# Patient Record
Sex: Female | Born: 2000 | Race: White
Health system: Southern US, Community
[De-identification: ages and names within clinical notes are randomized; demographics above are authoritative.]

## PROBLEM LIST (undated history)

## (undated) DIAGNOSIS — J984 Other disorders of lung: Secondary | ICD-10-CM

## (undated) DIAGNOSIS — K769 Liver disease, unspecified: Secondary | ICD-10-CM

## (undated) HISTORY — PX: ORIF DISTAL RADIUS FRACTURE: SUR927

## (undated) HISTORY — DX: Cystic fibrosis, unspecified: E84.9

## (undated) HISTORY — DX: Liver disease, unspecified: K76.9

---
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
## 2019-02-18 ENCOUNTER — Ambulatory Visit (INDEPENDENT_AMBULATORY_CARE_PROVIDER_SITE_OTHER): Payer: No Typology Code available for payment source | Admitting: Pediatric Pulmonology

## 2019-02-26 ENCOUNTER — Other Ambulatory Visit (INDEPENDENT_AMBULATORY_CARE_PROVIDER_SITE_OTHER): Payer: Self-pay

## 2019-02-26 ENCOUNTER — Ambulatory Visit (INDEPENDENT_AMBULATORY_CARE_PROVIDER_SITE_OTHER): Payer: No Typology Code available for payment source | Admitting: Pediatric Pulmonology

## 2019-02-26 ENCOUNTER — Ambulatory Visit (INDEPENDENT_AMBULATORY_CARE_PROVIDER_SITE_OTHER): Payer: No Typology Code available for payment source

## 2019-02-26 DIAGNOSIS — R634 Abnormal weight loss: Secondary | ICD-10-CM

## 2019-02-26 DIAGNOSIS — K8689 Other specified diseases of pancreas: Secondary | ICD-10-CM

## 2019-02-26 DIAGNOSIS — K745 Biliary cirrhosis, unspecified: Secondary | ICD-10-CM

## 2019-02-26 DIAGNOSIS — J15212 Pneumonia due to Methicillin resistant Staphylococcus aureus: Secondary | ICD-10-CM

## 2019-02-26 NOTE — Progress Notes (Signed)
Tech only

## 2019-02-26 NOTE — Progress Notes (Signed)
Received verbal order from Dr. Sami for Chest XR PA and Lateral for follow up pneumonia. Order reviewed by Dr. Sami and approved.     Order signed and provided to patient.

## 2019-02-27 ENCOUNTER — Telehealth (INDEPENDENT_AMBULATORY_CARE_PROVIDER_SITE_OTHER): Payer: Self-pay

## 2019-02-27 NOTE — Telephone Encounter (Signed)
Harriett Sine from [facility] called and would like end of therapy directions/orders for patient. States pt received last IV Ceftin yesterday and needs instructions on what to do next. Forwarded message to Dr. Marcelline Deist.

## 2019-02-28 NOTE — Telephone Encounter (Signed)
Dr. Marcelline Deist called Alison Boone.

## 2019-03-07 DIAGNOSIS — R634 Abnormal weight loss: Secondary | ICD-10-CM | POA: Insufficient documentation

## 2019-03-07 NOTE — Progress Notes (Signed)
[facility]  PEDIATRIC PULMONOLOGY OUTPATIENT VISIT    Patient Name: Alison Boone  Primary Care Physician: Dr. Lendell Caprice, Staci Righter, MD  Referring Physician: Dr. Lendell Caprice    Patient Complaint:   Follow-up    History of Present Illness:   Alison Boone is a 18 y.o. 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 She had had a mild cough while in [place] and started on Bactrim which she took for 4 days. She was then referred to the ED for abdominal pain and distension. There it was discovered that she had acute liver decompensation and was air-lifted to [facility] where her GI team is located. She was hospitalized at [facility] for more than 2 weeks. She had an extensive liver transplant evaluation and investigations. Her chest CT showed a severe L-sided pneumonia and she was treated with IV Tobramycin and Cefepime initially. The Tobramycin was discontinued because of concern for kidney injury. She was discharged home on IV Cefepime and has almost completed 3 weeks. PICC is still in place.   Her cough has improved significantly however she becomes short of breath when she tries to exercise and is generally very fatigued and not able to do as much.     Alison Boone is accompanied by mother but both provided history.       Allergies:     Allergies   Allergen Reactions   . Pistachio [Tree Nuts] Rash       Medication:     Current Outpatient Medications   Medication Sig Dispense Refill   . cetirizine (ZYRTEC) 5 MG tablet Take 10 mg by mouth daily        . dornase alpha (PULMOZYME) 1 MG/ML nebulizer solution Inhale 2.5 mg into the lungs.     . fluticasone-salmeterol (ADVAIR DISKUS) 250-50 MCG/DOSE Aerosol Powder, Breath Activtivatede Inhale 1 puff into the lungs 2 (two) times daily.     . insulin aspart (NOVOLOG) 100 UNIT/ML injection Inject into the skin 3 (three) times daily before meals.     . insulin  glargine (LANTUS) 100 UNIT/ML injection Inject 7 Units into the skin daily.     . Multiple Vitamins-Minerals (AQUADEKS PO) Take 2 tablets by mouth.     . pancrelipase, Lip-Prot-Amyl, (CREON) 12000 UNITS Cap DR Particles capsule Take 12,000 units of lipase by mouth 3 (three) times daily with meals.     . pancrelipase, Lip-Prot-Amyl, (CREON) 24000-76000 units Cap DR Particles Take 2 capsules of 24,000 units of lipase by mouth with meals and snacks. 300 capsule 5   . pantoprazole (PROTONIX) 40 MG tablet Take 40 mg by mouth daily     . phytonadione (MEPHYTON) 5 MG tablet Take 5 mg by mouth once     . sodium chloride 7 % Nebu Soln Take 4 mLs by nebulization 2 (two) times daily. 240 mL 5   . spironolactone (ALDACTONE) 100 MG tablet Take 100 mg by mouth 2 (two) times daily     . vitamin D (CHOLECALCIFEROL) 400 UNIT tablet Take 400 Units by mouth daily.     . vitamin E (vitamin E) 1000 UNIT capsule Take 1,000 Units by mouth daily     . albuterol-ipratropium (DUO-NEB) 2.5-0.5(3) mg/3 mL nebulizer Take 3 mLs by nebulization 2 (two) times daily as needed (as needed per sick plan) 90 mL 5   . ONETOUCH VERIO test strip TEST 6 TIMES PER DAY  6  No current facility-administered medications for this visit.        Past Medical History:     Past Medical History:   Diagnosis Date   . Cystic fibrosis    . Disease of lung    . Disorder of liver        Past Medical History personally reviewed  Past Surgical History:     Past Surgical History:   Procedure Laterality Date   . ORIF DISTAL RADIUS FRACTURE     . ORIF, FOREARM Right 10/10/2013    Procedure: ORIF, FOREARM;  Surgeon: Briscoe Deutscher, MD;  Location: [facility] OR;  Service: Orthopedics;  Laterality: Right;  RIGHT FOREARM OPEN REDUCTION INTERNAL FIXATION  W/ SYNTHES ELASTIC NAIL   . REMOVAL HARDWARE UPPER EXTREMITY MINOR LESS THAN 1 HOUR Right 12/12/2013    Procedure: REMOVAL HARDWARE UPPER EXTREMITY MINOR LESS THAN 1 HOUR;  Surgeon: Briscoe Deutscher, MD;  Location: [facility]  OR;  Service: Orthopedics;  Laterality: Right;  RIGHT RADIUS ROD REMOVAL       Family History:   No family history on file.  Family history personally reviewed  Social History:     Social History     Socioeconomic History   . Marital status: Single     Spouse name: Not on file   . Number of children: Not on file   . Years of education: Not on file   . Highest education level: Not on file   Occupational History   . Not on file   Social Needs   . Financial resource strain: Not on file   . Food insecurity     Worry: Not on file     Inability: Not on file   . Transportation needs     Medical: Not on file     Non-medical: Not on file   Tobacco Use   . Smoking status: Never Smoker   Substance and Sexual Activity   . Alcohol use: Not on file   . Drug use: Not on file   . Sexual activity: Not on file   Lifestyle   . Physical activity     Days per week: Not on file     Minutes per session: Not on file   . Stress: Not on file   Relationships   . Social Wellsite geologist on phone: Not on file     Gets together: Not on file     Attends religious service: Not on file     Active member of club or organization: Not on file     Attends meetings of clubs or organizations: Not on file     Relationship status: Not on file   . Intimate partner violence     Fear of current or ex partner: Not on file     Emotionally abused: Not on file     Physically abused: Not on file     Forced sexual activity: Not on file   Other Topics Concern   . Not on file   Social History Narrative   . Not on file     Social history reviewed and updated  Review of Systems:     On review of systems there were no other pulmonary symptoms. Her sleep pattern was sig. For being increased - she is now taking a nap during the day. Her appetite was decreased and she has lost a lot of weight during this hospitalization. She is eating a little  more since discharge but nor enough to gain significant weight.  She has pale but normal bowel movements (1-2/day). There were  no complaints of reflux. Growth and development were normal. There were no complaints referable to her cardiovascular, genitourinary, or neurological systems.   She has CFRD and her Insulin requirements have decreased considerably - she was using only short-acting during the hospitalization and was carb-counting.       Physical Exam:     Vitals:    02/26/19 1312   BP: 114/76   BP Site: Left arm   Patient Position: Sitting   Cuff Size: Small   Pulse: 109   SpO2: 100%   Weight: 45.7 kg (100 lb 12 oz)   Height: 1.593 m (5' 2.72")       Physical Exam  General: Well developed and thin.  HEENT: Head: normocephalic  Ears: tympanic membranes normal bilaterally  Significant for: Nasal turbinates: boggy  Hard palate: high  Soft palate: Long  Mallampati class: 3  Tonsils: not seen  Neck: supple, palpable cervical lymphadenopathy  Chest: symmetric with an increased AP diameter and no accessory muscle use. Equal good aeration bilaterally with harshbreath sounds. Crackles over L. base  Cardiac: S1 and S2 with no murmurs  Abdomen: flat, soft, non-tender, no palpable masses. Liver: palpable at 3-4 cm, spleen palpable 3-4 cm  Extremities: tracedigital clubbing  Musculoskeletal: back midline  Neurological: grossly non-focal  Psychiatric: normal affect  Skin: clear and dry    Labs:        Rads:    CXR: Improved significantly but still showing residual infiltrate/atelectasis in L. 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 She has CFRD, biliary cirrhosis and portal hypertension. She has significant weight loss.     D/W mother that we would keep the PICC in place since its placement was very difficult. We would give her 7-10 days break and check a throat culture today. Based on that growth we would make future antibiotic selection for another 2 week course and then repeat the  CXR.     Will also d/w ID re antibiotic recommendations     Teleconferenced with nutritionist who made recommendations for supplements and enzyme dose   Continue Insulin per endocrinology recs  D/W mum and [name] that we would not be able to start Trikafta back again until she is "cleared" from a liver standpoint which at this point will be after she is transplanted.     FUP: 3-4 weeks       Orders Placed This Encounter   Procedures   . SPIROMETRY REPORT SCAN            Earlean Shawl, MD  Pediatric Pulmonology Physician  [facility]  Dept: [phone]    03/07/2019 2:38 PM

## 2019-03-11 ENCOUNTER — Ambulatory Visit (INDEPENDENT_AMBULATORY_CARE_PROVIDER_SITE_OTHER): Payer: No Typology Code available for payment source | Admitting: Pediatric Pulmonology

## 2019-04-02 ENCOUNTER — Ambulatory Visit (INDEPENDENT_AMBULATORY_CARE_PROVIDER_SITE_OTHER): Payer: No Typology Code available for payment source

## 2019-04-02 ENCOUNTER — Telehealth (INDEPENDENT_AMBULATORY_CARE_PROVIDER_SITE_OTHER): Payer: Self-pay

## 2019-04-02 DIAGNOSIS — J15212 Pneumonia due to Methicillin resistant Staphylococcus aureus: Secondary | ICD-10-CM

## 2019-04-02 NOTE — Telephone Encounter (Signed)
Received verbal order from Dr. Marcelline Deist to enter chest xray for patient to follow up on left lower lobe pneumonia.   Orders entered and emailed to family.

## 2019-04-02 NOTE — Progress Notes (Signed)
Tech only

## 2019-04-08 ENCOUNTER — Ambulatory Visit (INDEPENDENT_AMBULATORY_CARE_PROVIDER_SITE_OTHER): Payer: No Typology Code available for payment source | Admitting: Pediatric Pulmonology

## 2019-04-08 ENCOUNTER — Ambulatory Visit (INDEPENDENT_AMBULATORY_CARE_PROVIDER_SITE_OTHER): Payer: No Typology Code available for payment source

## 2019-04-08 VITALS — BP 109/65 | HR 107 | Ht 62.21 in | Wt 102.5 lb

## 2019-04-08 DIAGNOSIS — K744 Secondary biliary cirrhosis: Secondary | ICD-10-CM

## 2019-04-08 DIAGNOSIS — K8689 Other specified diseases of pancreas: Secondary | ICD-10-CM

## 2019-04-08 DIAGNOSIS — J15212 Pneumonia due to Methicillin resistant Staphylococcus aureus: Secondary | ICD-10-CM

## 2019-04-08 DIAGNOSIS — E0801 Diabetes mellitus due to underlying condition with hyperosmolarity with coma: Secondary | ICD-10-CM

## 2019-04-08 DIAGNOSIS — Z794 Long term (current) use of insulin: Secondary | ICD-10-CM

## 2019-04-08 MED ORDER — SULFAMETHOXAZOLE-TRIMETHOPRIM 800-160 MG PO TABS
1.00 | ORAL_TABLET | Freq: Two times a day (BID) | ORAL | 1 refills | Status: AC
Start: 2019-04-08 — End: 2019-04-22

## 2019-04-08 MED ORDER — LEVOFLOXACIN IN D5W 500 MG/100ML IV SOLN
INTRAVENOUS | 0 refills | Status: DC
Start: 2019-04-08 — End: 2020-11-02

## 2019-04-08 NOTE — Progress Notes (Signed)
Verbal order received from Dr. Marcelline Deist, send new script for Bactrim DS 800-160mg , 1 tab twice daily x 14 days. Prescription read back and sent to pharmacy.     Verbal order called into [facility] by Dr. Marcelline Deist for Levofloxacin 500mg  IV every 24 hours x 14 days.   The infusion company is [employer] [facility] [employer]  [phone] [facility]  [phone]  Fax [phone]

## 2019-04-08 NOTE — Progress Notes (Signed)
Tech only

## 2019-04-08 NOTE — Progress Notes (Signed)
[facility]  PEDIATRIC PULMONOLOGY OUTPATIENT VISIT    Patient Name: Alison Boone  Primary Care Physician: Dr. Lendell Caprice, Staci Righter, MD  Referring Physician: Dr. Bonnetta Barry ref. provider found    Patient Complaint:   Follow-up    History of Present Illness:   Alison Boone is a 19 y.o. female who is here in consultation for CF and liver failure.  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  She was seen in pulmonary clinic at [facility] on Wednesday and had a marked decline in her pulmonary function.  CXR 2 days prior showed improvement in LLL infiltrate c/w prior study.  Since then her fluids have been liberalized and she is now drinking normally. However she is on a very large dose of diuretics - Spironolactone - 100 mg/day and Lasix - 30 mg/day. Her cough has been more productive and not as frequent. She is not having as much difficulty breathing.   Liver transplant prospects: her cousin who is a healthy 60 year old is a good match and has a suitable liver so can give one of her lobes. Another cousin will be investigated this week.     Alison Boone is accompanied by mother but both provided the history.    Allergies:     Allergies   Allergen Reactions   . Pistachio [Tree Nuts] Rash       Medication:     Current Outpatient Medications   Medication Sig Dispense Refill   . albuterol-ipratropium (DUO-NEB) 2.5-0.5(3) mg/3 mL nebulizer Take 3 mLs by nebulization 2 (two) times daily as needed (as needed per sick plan) 90 mL 5   . cetirizine (ZYRTEC) 5 MG tablet Take 10 mg by mouth daily        . dornase alpha (PULMOZYME) 1 MG/ML nebulizer solution Inhale 2.5 mg into the lungs.     . furosemide (LASIX) 20 MG tablet Take 20 mg by mouth 2 (two) times daily 20mg  in the am and 10mg  in the pm     . Multiple Vitamins-Minerals (AQUADEKS PO) Take 2 tablets by mouth.     . pancrelipase, Lip-Prot-Amyl, (CREON) 24000-76000 units Cap DR Particles Take 2 capsules of 24,000 units of lipase by mouth with meals and snacks. 300 capsule 5   . pantoprazole  (PROTONIX) 40 MG tablet Take 40 mg by mouth daily     . sodium chloride 7 % Nebu Soln Take 4 mLs by nebulization 2 (two) times daily. 240 mL 5   . spironolactone (ALDACTONE) 100 MG tablet Take 50 mg by mouth 2 (two) times daily        . vitamin D (CHOLECALCIFEROL) 400 UNIT tablet Take 400 Units by mouth daily.     . vitamin E (vitamin E) 1000 UNIT capsule Take 1,000 Units by mouth daily     . insulin aspart (NOVOLOG) 100 UNIT/ML injection Inject into the skin 3 (three) times daily before meals.     . insulin glargine (LANTUS) 100 UNIT/ML injection Inject 7 Units into the skin daily.     [name] VERIO test strip TEST 6 TIMES PER DAY  6     No current facility-administered medications for this visit.        Past Medical History:     Past Medical History:   Diagnosis Date   . Cystic fibrosis    . Disease of lung    . Disorder of liver        Past Medical History personally reviewed  Past Surgical History:  Past Surgical History:   Procedure Laterality Date   . ORIF DISTAL RADIUS FRACTURE     . ORIF, FOREARM Right 10/10/2013    Procedure: ORIF, FOREARM;  Surgeon: Briscoe Deutscher, MD;  Location: [facility] OR;  Service: Orthopedics;  Laterality: Right;  RIGHT FOREARM OPEN REDUCTION INTERNAL FIXATION  W/ SYNTHES ELASTIC NAIL   . REMOVAL HARDWARE UPPER EXTREMITY MINOR LESS THAN 1 HOUR Right 12/12/2013    Procedure: REMOVAL HARDWARE UPPER EXTREMITY MINOR LESS THAN 1 HOUR;  Surgeon: Briscoe Deutscher, MD;  Location: [facility] OR;  Service: Orthopedics;  Laterality: Right;  RIGHT RADIUS ROD REMOVAL       Family History:   No family history on file.  Family history personally reviewed  Social History:     Social History     Socioeconomic History   . Marital status: Single     Spouse name: Not on file   . Number of children: Not on file   . Years of education: Not on file   . Highest education level: Not on file   Occupational History   . Not on file   Social Needs   . Financial resource strain: Not on file   . Food  insecurity     Worry: Not on file     Inability: Not on file   . Transportation needs     Medical: Not on file     Non-medical: Not on file   Tobacco Use   . Smoking status: Never Smoker   Substance and Sexual Activity   . Alcohol use: Not on file   . Drug use: Not on file   . Sexual activity: Not on file   Lifestyle   . Physical activity     Days per week: Not on file     Minutes per session: Not on file   . Stress: Not on file   Relationships   . Social Wellsite geologist on phone: Not on file     Gets together: Not on file     Attends religious service: Not on file     Active member of club or organization: Not on file     Attends meetings of clubs or organizations: Not on file     Relationship status: Not on file   . Intimate partner violence     Fear of current or ex partner: Not on file     Emotionally abused: Not on file     Physically abused: Not on file     Forced sexual activity: Not on file   Other Topics Concern   . Not on file   Social History Narrative   . Not on file     Social history reviewed and updated  Review of Systems:     On review of systems there were no other pulmonary symptoms. Her sleep pattern was increased. Her appetite was improved as she is now only on 50% of the TPN, with normal bowel movements (1-2/day). There were no complaints of reflux. Growth and development were normal. There were no complaints referable to her cardiovascular, genitourinary, or neurological systems.  Endocrine: Her blood sugar has been slightly higher since she has been on Prednisone but it has been manageable.   She has also been depressed and not wanting to face-time friends but recently met with a few friends in a park.        Physical Exam:     Vitals:  04/08/19 0939   BP: 109/65   BP Site: Left arm   Patient Position: Sitting   Pulse: 107   SpO2: 96%   Weight: 46.5 kg (102 lb 8.2 oz)   Height: 1.58 m (5' 2.21")       Physical Exam  General: Well developed and thin. Visibly jaundiced.   HEENT: Head:  normocephalic  Ears: tympanic membranes normal bilaterally  Significant for: Nasal turbinates: boggy  Hard palate: slightly high and erythematous, however mucosa moist and not as dry as previously  Soft palate: long  Mallampati class: 2-3  Tonsils: not visualized  Neck: supple, no palpable cervical lymphadenopathy  Chest: symmetric with a normal AP diameter and no accessory muscle use. Equal good aeration bilaterally with crackles mostly on left side anteriorly and posteriorly - exam not improved from previously.  Cardiac: S1 and S2 with no murmurs  Abdomen: soft, non-tender, hepatosplenomegaly:+.   Extremities: digital clubbing  Musculoskeletal: back midline  Neurological: grossly non-focal  Psychiatric: normal affect  Skin: clear and dry    Labs:        Rads:    CXR: no significant change    Spirometry:    Severe lower airway obstruction - unchanged from 1/6     Assessment and Recommendations:   Ale[place] is a 19 y.o. female with CF and liver failure.  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     Cx from 04/05/19 is culturing S. Maltophilia sensitive to Bactrim and Levofloxacin - D/W Dr. Luciano Cutter in ID sensitivities and choice of antibiotics.     Also e-mailed [facility] hepatologist and transplant surgeon who agree with antibiotic choice.     Will start on Bactrim 1 DS BID x 2 weeks   IV Levofloxacin 500 mg once daily for 2 weeks   Will order a new Aerobika    FUP: 1 week - will arrange.    Orders Placed This Encounter   Procedures   . X-ray chest PA and lateral     Standing Status:   Future     Standing Expiration Date:   04/07/2020     Order Specific Question:   Is the patient pregnant?     Answer:   No     Order Specific Question:   Reason for Exam:     Answer:   f/u left lower lobe pneumonia            Earlean Shawl, MD  Pediatric Pulmonology Physician  [facility]  Dept: [phone]    04/08/2019 10:39 AM

## 2019-04-09 ENCOUNTER — Telehealth (INDEPENDENT_AMBULATORY_CARE_PROVIDER_SITE_OTHER): Payer: Self-pay

## 2019-04-09 ENCOUNTER — Encounter (INDEPENDENT_AMBULATORY_CARE_PROVIDER_SITE_OTHER): Payer: Self-pay | Admitting: Pediatric Pulmonology

## 2019-04-09 NOTE — Telephone Encounter (Signed)
Per Dr. Marcelline Deist, send [place] orders to DME company.     Faxed [employer] orders to [employer] at [employer]: [phone].

## 2019-04-09 NOTE — Telephone Encounter (Signed)
[place] at [employer] [facility] lvm on nurse's line stating insurance is not covering pt's [place]. She needs demographic sheet faxed so she can inform patient on options.

## 2019-04-22 ENCOUNTER — Ambulatory Visit (INDEPENDENT_AMBULATORY_CARE_PROVIDER_SITE_OTHER): Payer: No Typology Code available for payment source | Admitting: Pediatric Pulmonology

## 2019-06-11 ENCOUNTER — Other Ambulatory Visit (FREE_STANDING_LABORATORY_FACILITY): Payer: No Typology Code available for payment source

## 2019-06-11 DIAGNOSIS — Z944 Liver transplant status: Secondary | ICD-10-CM

## 2019-06-11 LAB — CBC AND DIFFERENTIAL
Absolute NRBC: 0 10*3/uL (ref 0.00–0.00)
Basophils Absolute Automated: 0.04 10*3/uL (ref 0.00–0.08)
Basophils Automated: 1.1 %
Eosinophils Absolute Automated: 0.29 10*3/uL (ref 0.00–0.44)
Eosinophils Automated: 8.2 %
Hematocrit: 36.5 % (ref 34.7–43.7)
Hgb: 11.5 g/dL (ref 11.4–14.8)
Immature Granulocytes Absolute: 0.01 10*3/uL (ref 0.00–0.07)
Immature Granulocytes: 0.3 %
Lymphocytes Absolute Automated: 1.77 10*3/uL (ref 0.42–3.22)
Lymphocytes Automated: 49.9 %
MCH: 29.8 pg (ref 25.1–33.5)
MCHC: 31.5 g/dL (ref 31.5–35.8)
MCV: 94.6 fL (ref 78.0–96.0)
MPV: 11.5 fL (ref 8.9–12.5)
Monocytes Absolute Automated: 0.22 10*3/uL (ref 0.21–0.85)
Monocytes: 6.2 %
Neutrophils Absolute: 1.22 10*3/uL (ref 1.10–6.33)
Neutrophils: 34.3 %
Nucleated RBC: 0 /100 WBC (ref 0.0–0.0)
Platelets: 55 10*3/uL — ABNORMAL LOW (ref 142–346)
RBC: 3.86 10*6/uL — ABNORMAL LOW (ref 3.90–5.10)
RDW: 22 % — ABNORMAL HIGH (ref 11–15)
WBC: 3.55 10*3/uL (ref 3.10–9.50)

## 2019-06-11 LAB — HEMOLYSIS INDEX: Hemolysis Index: 13 (ref 0–18)

## 2019-06-11 LAB — COMPREHENSIVE METABOLIC PANEL
ALT: 49 U/L (ref 0–55)
AST (SGOT): 50 U/L — ABNORMAL HIGH (ref 5–34)
Albumin/Globulin Ratio: 1.1 (ref 0.9–2.2)
Albumin: 3.4 g/dL — ABNORMAL LOW (ref 3.5–5.0)
Alkaline Phosphatase: 277 U/L — ABNORMAL HIGH (ref 50–130)
Anion Gap: 11 (ref 5.0–15.0)
BUN: 14 mg/dL (ref 7.0–19.0)
Bilirubin, Total: 1.3 mg/dL — ABNORMAL HIGH (ref 0.2–1.2)
CO2: 24 mEq/L (ref 21–29)
Calcium: 8.7 mg/dL (ref 8.5–10.5)
Chloride: 105 mEq/L (ref 100–111)
Creatinine: 0.8 mg/dL (ref 0.4–1.5)
Globulin: 3.2 g/dL (ref 2.0–3.7)
Glucose: 142 mg/dL — ABNORMAL HIGH (ref 70–100)
Potassium: 4.4 mEq/L (ref 3.5–5.1)
Protein, Total: 6.6 g/dL (ref 6.0–8.3)
Sodium: 140 mEq/L (ref 136–145)

## 2019-06-11 LAB — CELL MORPHOLOGY
Cell Morphology: ABNORMAL — AB
Platelet Estimate: DECREASED — AB

## 2019-06-11 LAB — MAGNESIUM: Magnesium: 1.7 mg/dL (ref 1.6–2.6)

## 2019-06-11 LAB — BILIRUBIN, DIRECT: Bilirubin Direct: 0.7 mg/dL — ABNORMAL HIGH (ref 0.0–0.5)

## 2019-06-11 LAB — GGT: GGT: 134 U/L — ABNORMAL HIGH (ref 9–36)

## 2019-06-11 LAB — PREALBUMIN: Prealbumin: 23 mg/dL (ref 16.0–38.0)

## 2019-06-11 LAB — PHOSPHORUS: Phosphorus: 3.3 mg/dL (ref 2.3–4.7)

## 2019-06-11 LAB — TACROLIMUS LEVEL: FK506: 3.8 ng/mL — ABNORMAL LOW (ref 5.0–20.0)

## 2019-06-20 ENCOUNTER — Other Ambulatory Visit (FREE_STANDING_LABORATORY_FACILITY): Payer: No Typology Code available for payment source

## 2019-06-20 DIAGNOSIS — Z944 Liver transplant status: Secondary | ICD-10-CM

## 2019-06-20 LAB — CBC AND DIFFERENTIAL
Absolute NRBC: 0 10*3/uL (ref 0.00–0.00)
Basophils Absolute Automated: 0.02 10*3/uL (ref 0.00–0.08)
Basophils Automated: 0.6 %
Eosinophils Absolute Automated: 0.06 10*3/uL (ref 0.00–0.44)
Eosinophils Automated: 1.9 %
Hematocrit: 34.5 % — ABNORMAL LOW (ref 34.7–43.7)
Hgb: 11.2 g/dL — ABNORMAL LOW (ref 11.4–14.8)
Immature Granulocytes Absolute: 0.01 10*3/uL (ref 0.00–0.07)
Immature Granulocytes: 0.3 %
Lymphocytes Absolute Automated: 1.6 10*3/uL (ref 0.42–3.22)
Lymphocytes Automated: 49.4 %
MCH: 30 pg (ref 25.1–33.5)
MCHC: 32.5 g/dL (ref 31.5–35.8)
MCV: 92.5 fL (ref 78.0–96.0)
MPV: 10.2 fL (ref 8.9–12.5)
Monocytes Absolute Automated: 0.25 10*3/uL (ref 0.21–0.85)
Monocytes: 7.7 %
Neutrophils Absolute: 1.3 10*3/uL (ref 1.10–6.33)
Neutrophils: 40.1 %
Nucleated RBC: 0 /100 WBC (ref 0.0–0.0)
Platelets: 55 10*3/uL — ABNORMAL LOW (ref 142–346)
RBC: 3.73 10*6/uL — ABNORMAL LOW (ref 3.90–5.10)
RDW: 17 % — ABNORMAL HIGH (ref 11–15)
WBC: 3.24 10*3/uL (ref 3.10–9.50)

## 2019-06-20 LAB — COMPREHENSIVE METABOLIC PANEL
ALT: 38 U/L (ref 0–55)
AST (SGOT): 27 U/L (ref 5–34)
Albumin/Globulin Ratio: 1.2 (ref 0.9–2.2)
Albumin: 3.6 g/dL (ref 3.5–5.0)
Alkaline Phosphatase: 141 U/L — ABNORMAL HIGH (ref 50–130)
Anion Gap: 7 (ref 5.0–15.0)
BUN: 16 mg/dL (ref 7.0–19.0)
Bilirubin, Total: 1 mg/dL (ref 0.2–1.2)
CO2: 27 mEq/L (ref 21–29)
Calcium: 8.9 mg/dL (ref 8.5–10.5)
Chloride: 105 mEq/L (ref 100–111)
Creatinine: 0.9 mg/dL (ref 0.4–1.5)
Globulin: 3 g/dL (ref 2.0–3.7)
Glucose: 146 mg/dL — ABNORMAL HIGH (ref 70–100)
Potassium: 5.1 mEq/L (ref 3.5–5.1)
Protein, Total: 6.6 g/dL (ref 6.0–8.3)
Sodium: 139 mEq/L (ref 136–145)

## 2019-06-20 LAB — BILIRUBIN, DIRECT: Bilirubin Direct: 0.5 mg/dL (ref 0.0–0.5)

## 2019-06-20 LAB — GGT: GGT: 73 U/L — ABNORMAL HIGH (ref 9–36)

## 2019-06-20 LAB — TACROLIMUS LEVEL: FK506: 5.3 ng/mL (ref 5.0–20.0)

## 2019-06-20 LAB — MAGNESIUM: Magnesium: 1.8 mg/dL (ref 1.6–2.6)

## 2019-06-20 LAB — HEMOLYSIS INDEX: Hemolysis Index: 4 (ref 0–18)

## 2019-06-20 LAB — PHOSPHORUS: Phosphorus: 3.9 mg/dL (ref 2.3–4.7)

## 2019-06-21 ENCOUNTER — Encounter (INDEPENDENT_AMBULATORY_CARE_PROVIDER_SITE_OTHER): Payer: Self-pay | Admitting: Pediatric Pulmonology

## 2019-06-23 LAB — VITAMIN E: Vitamin E (Alpha Tocopherol): 18.9 mg/L — ABNORMAL HIGH (ref 5.5–17.0)

## 2019-06-27 ENCOUNTER — Other Ambulatory Visit (FREE_STANDING_LABORATORY_FACILITY): Payer: No Typology Code available for payment source

## 2019-06-27 DIAGNOSIS — Z944 Liver transplant status: Secondary | ICD-10-CM

## 2019-06-27 LAB — CBC AND DIFFERENTIAL
Absolute NRBC: 0 10*3/uL (ref 0.00–0.00)
Basophils Absolute Automated: 0.02 10*3/uL (ref 0.00–0.08)
Basophils Automated: 0.6 %
Eosinophils Absolute Automated: 0.07 10*3/uL (ref 0.00–0.44)
Eosinophils Automated: 2.2 %
Hematocrit: 36.9 % (ref 34.7–43.7)
Hgb: 12 g/dL (ref 11.4–14.8)
Immature Granulocytes Absolute: 0.01 10*3/uL (ref 0.00–0.07)
Immature Granulocytes: 0.3 %
Lymphocytes Absolute Automated: 1.51 10*3/uL (ref 0.42–3.22)
Lymphocytes Automated: 47.3 %
MCH: 30.3 pg (ref 25.1–33.5)
MCHC: 32.5 g/dL (ref 31.5–35.8)
MCV: 93.2 fL (ref 78.0–96.0)
MPV: 9.7 fL (ref 8.9–12.5)
Monocytes Absolute Automated: 0.19 10*3/uL — ABNORMAL LOW (ref 0.21–0.85)
Monocytes: 6 %
Neutrophils Absolute: 1.39 10*3/uL (ref 1.10–6.33)
Neutrophils: 43.6 %
Nucleated RBC: 0 /100 WBC (ref 0.0–0.0)
Platelets: 47 10*3/uL — ABNORMAL LOW (ref 142–346)
RBC: 3.96 10*6/uL (ref 3.90–5.10)
RDW: 15 % (ref 11–15)
WBC: 3.19 10*3/uL (ref 3.10–9.50)

## 2019-06-27 LAB — COMPREHENSIVE METABOLIC PANEL
ALT: 41 U/L (ref 0–55)
AST (SGOT): 32 U/L (ref 5–34)
Albumin/Globulin Ratio: 1.3 (ref 0.9–2.2)
Albumin: 3.7 g/dL (ref 3.5–5.0)
Alkaline Phosphatase: 89 U/L (ref 50–130)
Anion Gap: 12 (ref 5.0–15.0)
BUN: 15 mg/dL (ref 7.0–19.0)
Bilirubin, Total: 0.9 mg/dL (ref 0.2–1.2)
CO2: 25 mEq/L (ref 21–29)
Calcium: 9.4 mg/dL (ref 8.5–10.5)
Chloride: 104 mEq/L (ref 100–111)
Creatinine: 0.9 mg/dL (ref 0.4–1.5)
Globulin: 2.8 g/dL (ref 2.0–3.7)
Glucose: 139 mg/dL — ABNORMAL HIGH (ref 70–100)
Potassium: 5.1 mEq/L (ref 3.5–5.1)
Protein, Total: 6.5 g/dL (ref 6.0–8.3)
Sodium: 141 mEq/L (ref 136–145)

## 2019-06-27 LAB — MAGNESIUM: Magnesium: 1.8 mg/dL (ref 1.6–2.6)

## 2019-06-27 LAB — HEMOLYSIS INDEX: Hemolysis Index: 5 (ref 0–18)

## 2019-06-27 LAB — BILIRUBIN, DIRECT: Bilirubin Direct: 0.4 mg/dL (ref 0.0–0.5)

## 2019-06-27 LAB — TACROLIMUS LEVEL: FK506: 5.3 ng/mL (ref 5.0–20.0)

## 2019-06-27 LAB — GGT: GGT: 41 U/L — ABNORMAL HIGH (ref 9–36)

## 2019-06-27 LAB — PHOSPHORUS: Phosphorus: 4.4 mg/dL (ref 2.3–4.7)

## 2019-06-27 LAB — IMMATURE PLT FRACTION: Immature Platelet Fraction: 2.5 % (ref 0.1–8.6)

## 2019-07-02 ENCOUNTER — Other Ambulatory Visit (FREE_STANDING_LABORATORY_FACILITY): Payer: No Typology Code available for payment source

## 2019-07-02 DIAGNOSIS — Z940 Kidney transplant status: Secondary | ICD-10-CM

## 2019-07-02 LAB — CBC AND DIFFERENTIAL
Absolute NRBC: 0 10*3/uL (ref 0.00–0.00)
Basophils Absolute Automated: 0.01 10*3/uL (ref 0.00–0.08)
Basophils Automated: 0.3 %
Eosinophils Absolute Automated: 0.1 10*3/uL (ref 0.00–0.44)
Eosinophils Automated: 2.8 %
Hematocrit: 36.2 % (ref 34.7–43.7)
Hgb: 12.1 g/dL (ref 11.4–14.8)
Immature Granulocytes Absolute: 0.01 10*3/uL (ref 0.00–0.07)
Immature Granulocytes: 0.3 %
Lymphocytes Absolute Automated: 1.53 10*3/uL (ref 0.42–3.22)
Lymphocytes Automated: 43.1 %
MCH: 30.3 pg (ref 25.1–33.5)
MCHC: 33.4 g/dL (ref 31.5–35.8)
MCV: 90.7 fL (ref 78.0–96.0)
MPV: 10 fL (ref 8.9–12.5)
Monocytes Absolute Automated: 0.21 10*3/uL (ref 0.21–0.85)
Monocytes: 5.9 %
Neutrophils Absolute: 1.69 10*3/uL (ref 1.10–6.33)
Neutrophils: 47.6 %
Nucleated RBC: 0 /100 WBC (ref 0.0–0.0)
Platelets: 49 10*3/uL — ABNORMAL LOW (ref 142–346)
RBC: 3.99 10*6/uL (ref 3.90–5.10)
RDW: 14 % (ref 11–15)
WBC: 3.55 10*3/uL (ref 3.10–9.50)

## 2019-07-02 LAB — COMPREHENSIVE METABOLIC PANEL
ALT: 57 U/L — ABNORMAL HIGH (ref 0–55)
AST (SGOT): 30 U/L (ref 5–34)
Albumin/Globulin Ratio: 1.5 (ref 0.9–2.2)
Albumin: 3.8 g/dL (ref 3.5–5.0)
Alkaline Phosphatase: 87 U/L (ref 50–130)
Anion Gap: 11 (ref 5.0–15.0)
BUN: 13 mg/dL (ref 7.0–19.0)
Bilirubin, Total: 0.9 mg/dL (ref 0.2–1.2)
CO2: 28 mEq/L (ref 21–29)
Calcium: 9 mg/dL (ref 8.5–10.5)
Chloride: 102 mEq/L (ref 100–111)
Creatinine: 0.8 mg/dL (ref 0.4–1.5)
Globulin: 2.6 g/dL (ref 2.0–3.7)
Glucose: 114 mg/dL — ABNORMAL HIGH (ref 70–100)
Potassium: 4.7 mEq/L (ref 3.5–5.1)
Protein, Total: 6.4 g/dL (ref 6.0–8.3)
Sodium: 141 mEq/L (ref 136–145)

## 2019-07-02 LAB — TACROLIMUS LEVEL: FK506: 7.9 ng/mL (ref 5.0–20.0)

## 2019-07-02 LAB — HEMOLYSIS INDEX: Hemolysis Index: 8 (ref 0–18)

## 2019-07-02 LAB — IMMATURE PLT FRACTION: Immature Platelet Fraction: 2.6 % (ref 0.1–8.6)

## 2019-07-02 LAB — BILIRUBIN, DIRECT: Bilirubin Direct: 0.4 mg/dL (ref 0.0–0.5)

## 2019-07-02 LAB — GGT: GGT: 39 U/L — ABNORMAL HIGH (ref 9–36)

## 2019-07-02 LAB — PHOSPHORUS: Phosphorus: 3.9 mg/dL (ref 2.3–4.7)

## 2019-07-02 LAB — MAGNESIUM: Magnesium: 1.8 mg/dL (ref 1.6–2.6)

## 2019-07-04 ENCOUNTER — Other Ambulatory Visit (FREE_STANDING_LABORATORY_FACILITY): Payer: No Typology Code available for payment source

## 2019-07-04 DIAGNOSIS — Z944 Liver transplant status: Secondary | ICD-10-CM

## 2019-07-04 LAB — COMPREHENSIVE METABOLIC PANEL
ALT: 49 U/L (ref 0–55)
AST (SGOT): 23 U/L (ref 5–34)
Albumin/Globulin Ratio: 1.4 (ref 0.9–2.2)
Albumin: 3.8 g/dL (ref 3.5–5.0)
Alkaline Phosphatase: 81 U/L (ref 50–130)
Anion Gap: 10 (ref 5.0–15.0)
BUN: 17 mg/dL (ref 7.0–19.0)
Bilirubin, Total: 0.8 mg/dL (ref 0.2–1.2)
CO2: 25 mEq/L (ref 21–29)
Calcium: 8.6 mg/dL (ref 8.5–10.5)
Chloride: 105 mEq/L (ref 100–111)
Creatinine: 0.8 mg/dL (ref 0.4–1.5)
Globulin: 2.7 g/dL (ref 2.0–3.7)
Glucose: 95 mg/dL (ref 70–100)
Potassium: 4.5 mEq/L (ref 3.5–5.1)
Protein, Total: 6.5 g/dL (ref 6.0–8.3)
Sodium: 140 mEq/L (ref 136–145)

## 2019-07-04 LAB — CBC AND DIFFERENTIAL
Absolute NRBC: 0 10*3/uL (ref 0.00–0.00)
Basophils Absolute Automated: 0.02 10*3/uL (ref 0.00–0.08)
Basophils Automated: 0.5 %
Eosinophils Absolute Automated: 0.09 10*3/uL (ref 0.00–0.44)
Eosinophils Automated: 2.4 %
Hematocrit: 35.7 % (ref 34.7–43.7)
Hgb: 11.7 g/dL (ref 11.4–14.8)
Immature Granulocytes Absolute: 0.01 10*3/uL (ref 0.00–0.07)
Immature Granulocytes: 0.3 %
Lymphocytes Absolute Automated: 1.66 10*3/uL (ref 0.42–3.22)
Lymphocytes Automated: 44 %
MCH: 30.1 pg (ref 25.1–33.5)
MCHC: 32.8 g/dL (ref 31.5–35.8)
MCV: 91.8 fL (ref 78.0–96.0)
MPV: 10.2 fL (ref 8.9–12.5)
Monocytes Absolute Automated: 0.24 10*3/uL (ref 0.21–0.85)
Monocytes: 6.4 %
Neutrophils Absolute: 1.75 10*3/uL (ref 1.10–6.33)
Neutrophils: 46.4 %
Nucleated RBC: 0 /100 WBC (ref 0.0–0.0)
Platelets: 46 10*3/uL — ABNORMAL LOW (ref 142–346)
RBC: 3.89 10*6/uL — ABNORMAL LOW (ref 3.90–5.10)
RDW: 14 % (ref 11–15)
WBC: 3.77 10*3/uL (ref 3.10–9.50)

## 2019-07-04 LAB — TACROLIMUS LEVEL: FK506: 6.2 ng/mL (ref 5.0–20.0)

## 2019-07-04 LAB — EBV DNA, QUANTITATIVE PCR
Epstein-Barr Virus DNA Quan PCR Log Copies/ML: <2.3 (ref <2.30)
Epstein-Barr virus, DNA, QUAN PCR: <200 (ref <200)

## 2019-07-04 LAB — GGT: GGT: 36 U/L (ref 9–36)

## 2019-07-04 LAB — PHOSPHORUS: Phosphorus: 3.8 mg/dL (ref 2.3–4.7)

## 2019-07-04 LAB — BILIRUBIN, DIRECT: Bilirubin Direct: 0.4 mg/dL (ref 0.0–0.5)

## 2019-07-04 LAB — CYTOMEGALOVIRUS DNA, QUANTITATIVE REAL-TIME PCR
CMV DNA, QN PCR: <2.3 (ref <2.30)
CMV DNA,QN Real Time PCR: <200 (ref <200)

## 2019-07-04 LAB — HEMOLYSIS INDEX: Hemolysis Index: 8 (ref 0–18)

## 2019-07-04 LAB — IMMATURE PLT FRACTION: Immature Platelet Fraction: 2.1 % (ref 0.1–8.6)

## 2019-07-04 LAB — MAGNESIUM: Magnesium: 2 mg/dL (ref 1.6–2.6)

## 2019-07-08 ENCOUNTER — Other Ambulatory Visit (FREE_STANDING_LABORATORY_FACILITY): Payer: No Typology Code available for payment source

## 2019-07-08 DIAGNOSIS — Z944 Liver transplant status: Secondary | ICD-10-CM

## 2019-07-08 LAB — CBC AND DIFFERENTIAL
Absolute NRBC: 0 10*3/uL (ref 0.00–0.00)
Basophils Absolute Automated: 0.01 10*3/uL (ref 0.00–0.08)
Basophils Automated: 0.3 %
Eosinophils Absolute Automated: 0.1 10*3/uL (ref 0.00–0.44)
Eosinophils Automated: 2.6 %
Hematocrit: 39.5 % (ref 34.7–43.7)
Hgb: 13 g/dL (ref 11.4–14.8)
Immature Granulocytes Absolute: 0.02 10*3/uL (ref 0.00–0.07)
Immature Granulocytes: 0.5 %
Lymphocytes Absolute Automated: 1.59 10*3/uL (ref 0.42–3.22)
Lymphocytes Automated: 41.6 %
MCH: 29.6 pg (ref 25.1–33.5)
MCHC: 32.9 g/dL (ref 31.5–35.8)
MCV: 90 fL (ref 78.0–96.0)
MPV: 10.1 fL (ref 8.9–12.5)
Monocytes Absolute Automated: 0.24 10*3/uL (ref 0.21–0.85)
Monocytes: 6.3 %
Neutrophils Absolute: 1.86 10*3/uL (ref 1.10–6.33)
Neutrophils: 48.7 %
Nucleated RBC: 0 /100 WBC (ref 0.0–0.0)
Platelets: 51 10*3/uL — ABNORMAL LOW (ref 142–346)
RBC: 4.39 10*6/uL (ref 3.90–5.10)
RDW: 14 % (ref 11–15)
WBC: 3.82 10*3/uL (ref 3.10–9.50)

## 2019-07-08 LAB — COMPREHENSIVE METABOLIC PANEL
ALT: 70 U/L — ABNORMAL HIGH (ref 0–55)
AST (SGOT): 38 U/L — ABNORMAL HIGH (ref 5–34)
Albumin/Globulin Ratio: 1.5 (ref 0.9–2.2)
Albumin: 4.1 g/dL (ref 3.5–5.0)
Alkaline Phosphatase: 92 U/L (ref 50–130)
Anion Gap: 10 (ref 5.0–15.0)
BUN: 14 mg/dL (ref 7.0–19.0)
Bilirubin, Total: 0.8 mg/dL (ref 0.2–1.2)
CO2: 26 mEq/L (ref 21–29)
Calcium: 9.2 mg/dL (ref 8.5–10.5)
Chloride: 105 mEq/L (ref 100–111)
Creatinine: 1 mg/dL (ref 0.4–1.5)
Globulin: 2.8 g/dL (ref 2.0–3.7)
Glucose: 117 mg/dL — ABNORMAL HIGH (ref 70–100)
Potassium: 5.1 mEq/L (ref 3.5–5.1)
Protein, Total: 6.9 g/dL (ref 6.0–8.3)
Sodium: 141 mEq/L (ref 136–145)

## 2019-07-08 LAB — BILIRUBIN, DIRECT: Bilirubin Direct: 0.3 mg/dL (ref 0.0–0.5)

## 2019-07-08 LAB — PHOSPHORUS: Phosphorus: 3.8 mg/dL (ref 2.3–4.7)

## 2019-07-08 LAB — GGT: GGT: 65 U/L — ABNORMAL HIGH (ref 9–36)

## 2019-07-08 LAB — IMMATURE PLT FRACTION: Immature Platelet Fraction: 2.2 % (ref 0.1–8.6)

## 2019-07-08 LAB — MAGNESIUM: Magnesium: 1.8 mg/dL (ref 1.6–2.6)

## 2019-07-08 LAB — TACROLIMUS LEVEL: FK506: 5.3 ng/mL (ref 5.0–20.0)

## 2019-07-08 LAB — HEMOLYSIS INDEX: Hemolysis Index: 10 (ref 0–18)

## 2019-07-12 ENCOUNTER — Other Ambulatory Visit (FREE_STANDING_LABORATORY_FACILITY): Payer: No Typology Code available for payment source

## 2019-07-12 DIAGNOSIS — Z944 Liver transplant status: Secondary | ICD-10-CM

## 2019-07-12 LAB — CBC AND DIFFERENTIAL
Absolute NRBC: 0 10*3/uL (ref 0.00–0.00)
Basophils Absolute Automated: 0.02 10*3/uL (ref 0.00–0.08)
Basophils Automated: 0.5 %
Eosinophils Absolute Automated: 0.07 10*3/uL (ref 0.00–0.44)
Eosinophils Automated: 1.7 %
Hematocrit: 40 % (ref 34.7–43.7)
Hgb: 13.5 g/dL (ref 11.4–14.8)
Immature Granulocytes Absolute: 0.01 10*3/uL (ref 0.00–0.07)
Immature Granulocytes: 0.2 %
Lymphocytes Absolute Automated: 1.75 10*3/uL (ref 0.42–3.22)
Lymphocytes Automated: 43.2 %
MCH: 29.5 pg (ref 25.1–33.5)
MCHC: 33.8 g/dL (ref 31.5–35.8)
MCV: 87.3 fL (ref 78.0–96.0)
MPV: 9.4 fL (ref 8.9–12.5)
Monocytes Absolute Automated: 0.25 10*3/uL (ref 0.21–0.85)
Monocytes: 6.2 %
Neutrophils Absolute: 1.95 10*3/uL (ref 1.10–6.33)
Neutrophils: 48.2 %
Nucleated RBC: 0 /100 WBC (ref 0.0–0.0)
Platelets: 50 10*3/uL — ABNORMAL LOW (ref 142–346)
RBC: 4.58 10*6/uL (ref 3.90–5.10)
RDW: 13 % (ref 11–15)
WBC: 4.05 10*3/uL (ref 3.10–9.50)

## 2019-07-12 LAB — GGT: GGT: 54 U/L — ABNORMAL HIGH (ref 9–36)

## 2019-07-12 LAB — COMPREHENSIVE METABOLIC PANEL
ALT: 50 U/L (ref 0–55)
AST (SGOT): 24 U/L (ref 5–34)
Albumin/Globulin Ratio: 1.5 (ref 0.9–2.2)
Albumin: 4.5 g/dL (ref 3.5–5.0)
Alkaline Phosphatase: 95 U/L (ref 50–130)
Anion Gap: 11 (ref 5.0–15.0)
BUN: 18 mg/dL (ref 7.0–19.0)
Bilirubin, Total: 1 mg/dL (ref 0.2–1.2)
CO2: 28 mEq/L (ref 21–29)
Calcium: 9.5 mg/dL (ref 8.5–10.5)
Chloride: 102 mEq/L (ref 100–111)
Creatinine: 1 mg/dL (ref 0.4–1.5)
Globulin: 3.1 g/dL (ref 2.0–3.7)
Glucose: 139 mg/dL — ABNORMAL HIGH (ref 70–100)
Potassium: 4.6 mEq/L (ref 3.5–5.1)
Protein, Total: 7.6 g/dL (ref 6.0–8.3)
Sodium: 141 mEq/L (ref 136–145)

## 2019-07-12 LAB — MAGNESIUM: Magnesium: 2 mg/dL (ref 1.6–2.6)

## 2019-07-12 LAB — PHOSPHORUS: Phosphorus: 3.8 mg/dL (ref 2.3–4.7)

## 2019-07-12 LAB — BILIRUBIN, DIRECT: Bilirubin Direct: 0.4 mg/dL (ref 0.0–0.5)

## 2019-07-12 LAB — TACROLIMUS LEVEL: FK506: 7.3 ng/mL (ref 5.0–20.0)

## 2019-07-12 LAB — HEMOLYSIS INDEX: Hemolysis Index: 6 (ref 0–18)

## 2019-07-12 LAB — IMMATURE PLT FRACTION: Immature Platelet Fraction: 2.6 % (ref 0.1–8.6)

## 2019-07-15 ENCOUNTER — Encounter (INDEPENDENT_AMBULATORY_CARE_PROVIDER_SITE_OTHER): Payer: Self-pay

## 2019-07-15 ENCOUNTER — Other Ambulatory Visit (INDEPENDENT_AMBULATORY_CARE_PROVIDER_SITE_OTHER): Payer: Self-pay | Admitting: Pediatric Infectious Disease

## 2019-07-17 ENCOUNTER — Other Ambulatory Visit (INDEPENDENT_AMBULATORY_CARE_PROVIDER_SITE_OTHER): Payer: Self-pay | Admitting: Pediatric Pulmonology

## 2019-07-18 ENCOUNTER — Telehealth (INDEPENDENT_AMBULATORY_CARE_PROVIDER_SITE_OTHER): Payer: Self-pay

## 2019-07-18 ENCOUNTER — Other Ambulatory Visit (FREE_STANDING_LABORATORY_FACILITY): Payer: No Typology Code available for payment source

## 2019-07-18 DIAGNOSIS — Z944 Liver transplant status: Secondary | ICD-10-CM

## 2019-07-18 LAB — COMPREHENSIVE METABOLIC PANEL
ALT: 41 U/L (ref 0–55)
AST (SGOT): 25 U/L (ref 5–34)
Albumin/Globulin Ratio: 1.5 (ref 0.9–2.2)
Albumin: 4.1 g/dL (ref 3.5–5.0)
Alkaline Phosphatase: 78 U/L (ref 50–130)
Anion Gap: 10 (ref 5.0–15.0)
BUN: 14 mg/dL (ref 7.0–19.0)
Bilirubin, Total: 0.8 mg/dL (ref 0.2–1.2)
CO2: 27 mEq/L (ref 21–29)
Calcium: 9 mg/dL (ref 8.5–10.5)
Chloride: 104 mEq/L (ref 100–111)
Creatinine: 0.9 mg/dL (ref 0.4–1.5)
Globulin: 2.7 g/dL (ref 2.0–3.7)
Glucose: 125 mg/dL — ABNORMAL HIGH (ref 70–100)
Potassium: 5.3 mEq/L — ABNORMAL HIGH (ref 3.5–5.1)
Protein, Total: 6.8 g/dL (ref 6.0–8.3)
Sodium: 141 mEq/L (ref 136–145)

## 2019-07-18 LAB — CBC AND DIFFERENTIAL
Absolute NRBC: 0 10*3/uL (ref 0.00–0.00)
Basophils Absolute Automated: 0.01 10*3/uL (ref 0.00–0.08)
Basophils Automated: 0.3 %
Eosinophils Absolute Automated: 0.07 10*3/uL (ref 0.00–0.44)
Eosinophils Automated: 1.8 %
Hematocrit: 37 % (ref 34.7–43.7)
Hgb: 12.5 g/dL (ref 11.4–14.8)
Immature Granulocytes Absolute: 0.01 10*3/uL (ref 0.00–0.07)
Immature Granulocytes: 0.3 %
Lymphocytes Absolute Automated: 1.61 10*3/uL (ref 0.42–3.22)
Lymphocytes Automated: 40.3 %
MCH: 29.9 pg (ref 25.1–33.5)
MCHC: 33.8 g/dL (ref 31.5–35.8)
MCV: 88.5 fL (ref 78.0–96.0)
MPV: 9.4 fL (ref 8.9–12.5)
Monocytes Absolute Automated: 0.25 10*3/uL (ref 0.21–0.85)
Monocytes: 6.3 %
Neutrophils Absolute: 2.05 10*3/uL (ref 1.10–6.33)
Neutrophils: 51 %
Nucleated RBC: 0 /100 WBC (ref 0.0–0.0)
Platelets: 47 10*3/uL — ABNORMAL LOW (ref 142–346)
RBC: 4.18 10*6/uL (ref 3.90–5.10)
RDW: 14 % (ref 11–15)
WBC: 4 10*3/uL (ref 3.10–9.50)

## 2019-07-18 LAB — GGT: GGT: 38 U/L — ABNORMAL HIGH (ref 9–36)

## 2019-07-18 LAB — TACROLIMUS LEVEL: FK506: 8.1 ng/mL (ref 5.0–20.0)

## 2019-07-18 LAB — HEMOLYSIS INDEX: Hemolysis Index: 8 (ref 0–18)

## 2019-07-18 LAB — IMMATURE PLT FRACTION: Immature Platelet Fraction: 1.7 % (ref 0.1–8.6)

## 2019-07-18 LAB — PHOSPHORUS: Phosphorus: 4.6 mg/dL (ref 2.3–4.7)

## 2019-07-18 LAB — BILIRUBIN, DIRECT: Bilirubin Direct: 0.3 mg/dL (ref 0.0–0.5)

## 2019-07-18 LAB — MAGNESIUM: Magnesium: 1.8 mg/dL (ref 1.6–2.6)

## 2019-07-18 NOTE — Telephone Encounter (Signed)
Mom notified to reach out to [facility] for refill of Trikafta. Pt is back on full dose elexacaftor 100mg /tezacaftor 50mg /ivacaftor 75mg  2tablets in the morning, ivacaftor 150mg  1 tablet in the evening. Mom would like to know when Dr. Marcelline Deist wants to see pt next? Forwarded message to Dr. Marcelline Deist.

## 2019-07-18 NOTE — Telephone Encounter (Signed)
Mom returned call and lvm on nurse's line to call back.    Lvm for mom to call back regarding Trikafta.

## 2019-07-18 NOTE — Telephone Encounter (Signed)
Received VM from [facility] pharmacy regarding refill of Trikafta. States this medication needs to go to [facility] and not [facility]. Call back Number: [phone].    Attempted to call back pharmacy but unable to speak with a representative.     VM left for mom of patient to call back regarding medication.    Per Dr. Marcelline Deist, refill of Trikafta would be best to be done by [facility] team since pt has been under their care for liver transplant and there has been many changes in medication dosing. Waiting for a call back from mom.

## 2019-07-22 ENCOUNTER — Ambulatory Visit (INDEPENDENT_AMBULATORY_CARE_PROVIDER_SITE_OTHER): Payer: No Typology Code available for payment source

## 2019-07-22 ENCOUNTER — Other Ambulatory Visit (FREE_STANDING_LABORATORY_FACILITY): Payer: No Typology Code available for payment source

## 2019-07-22 DIAGNOSIS — Z944 Liver transplant status: Secondary | ICD-10-CM

## 2019-07-22 LAB — CBC AND DIFFERENTIAL
Absolute NRBC: 0 10*3/uL (ref 0.00–0.00)
Basophils Absolute Automated: 0.01 10*3/uL (ref 0.00–0.08)
Basophils Automated: 0.3 %
Eosinophils Absolute Automated: 0.05 10*3/uL (ref 0.00–0.44)
Eosinophils Automated: 1.4 %
Hematocrit: 35.1 % (ref 34.7–43.7)
Hgb: 11.9 g/dL (ref 11.4–14.8)
Immature Granulocytes Absolute: 0.01 10*3/uL (ref 0.00–0.07)
Immature Granulocytes: 0.3 %
Lymphocytes Absolute Automated: 1.35 10*3/uL (ref 0.42–3.22)
Lymphocytes Automated: 37.9 %
MCH: 29.8 pg (ref 25.1–33.5)
MCHC: 33.9 g/dL (ref 31.5–35.8)
MCV: 88 fL (ref 78.0–96.0)
MPV: 9.4 fL (ref 8.9–12.5)
Monocytes Absolute Automated: 0.27 10*3/uL (ref 0.21–0.85)
Monocytes: 7.6 %
Neutrophils Absolute: 1.87 10*3/uL (ref 1.10–6.33)
Neutrophils: 52.5 %
Nucleated RBC: 0 /100 WBC (ref 0.0–0.0)
Platelets: 51 10*3/uL — ABNORMAL LOW (ref 142–346)
RBC: 3.99 10*6/uL (ref 3.90–5.10)
RDW: 13 % (ref 11–15)
WBC: 3.56 10*3/uL (ref 3.10–9.50)

## 2019-07-22 LAB — COMPREHENSIVE METABOLIC PANEL
ALT: 32 U/L (ref 0–55)
AST (SGOT): 24 U/L (ref 5–34)
Albumin/Globulin Ratio: 1.7 (ref 0.9–2.2)
Albumin: 4 g/dL (ref 3.5–5.0)
Alkaline Phosphatase: 77 U/L (ref 50–130)
Anion Gap: 8 (ref 5.0–15.0)
BUN: 15 mg/dL (ref 7.0–19.0)
Bilirubin, Total: 0.9 mg/dL (ref 0.2–1.2)
CO2: 26 mEq/L (ref 21–29)
Calcium: 9 mg/dL (ref 8.5–10.5)
Chloride: 105 mEq/L (ref 100–111)
Creatinine: 1 mg/dL (ref 0.4–1.5)
Globulin: 2.4 g/dL (ref 2.0–3.7)
Glucose: 134 mg/dL — ABNORMAL HIGH (ref 70–100)
Potassium: 5.4 mEq/L — ABNORMAL HIGH (ref 3.5–5.1)
Protein, Total: 6.4 g/dL (ref 6.0–8.3)
Sodium: 139 mEq/L (ref 136–145)

## 2019-07-22 LAB — PHOSPHORUS: Phosphorus: 4.5 mg/dL (ref 2.3–4.7)

## 2019-07-22 LAB — IMMATURE PLT FRACTION: Immature Platelet Fraction: 1.9 % (ref 0.1–8.6)

## 2019-07-22 LAB — MAGNESIUM: Magnesium: 2.1 mg/dL (ref 1.6–2.6)

## 2019-07-22 LAB — BILIRUBIN, DIRECT: Bilirubin Direct: 0.4 mg/dL (ref 0.0–0.5)

## 2019-07-22 LAB — HEMOLYSIS INDEX: Hemolysis Index: 8 (ref 0–18)

## 2019-07-22 LAB — TACROLIMUS LEVEL: FK506: 11.6 ng/mL (ref 5.0–20.0)

## 2019-07-22 LAB — GGT: GGT: 35 U/L (ref 9–36)

## 2019-07-24 ENCOUNTER — Encounter (INDEPENDENT_AMBULATORY_CARE_PROVIDER_SITE_OTHER): Payer: Self-pay

## 2019-07-24 ENCOUNTER — Ambulatory Visit (INDEPENDENT_AMBULATORY_CARE_PROVIDER_SITE_OTHER): Payer: No Typology Code available for payment source

## 2019-07-24 DIAGNOSIS — Z23 Encounter for immunization: Secondary | ICD-10-CM

## 2019-07-25 ENCOUNTER — Other Ambulatory Visit (FREE_STANDING_LABORATORY_FACILITY): Payer: No Typology Code available for payment source

## 2019-07-25 DIAGNOSIS — Z944 Liver transplant status: Secondary | ICD-10-CM

## 2019-07-25 LAB — CBC AND DIFFERENTIAL
Absolute NRBC: 0 10*3/uL (ref 0.00–0.00)
Basophils Absolute Automated: 0.02 10*3/uL (ref 0.00–0.08)
Basophils Automated: 0.4 %
Eosinophils Absolute Automated: 0.1 10*3/uL (ref 0.00–0.44)
Eosinophils Automated: 2.1 %
Hematocrit: 35.6 % (ref 34.7–43.7)
Hgb: 12 g/dL (ref 11.4–14.8)
Immature Granulocytes Absolute: 0.02 10*3/uL (ref 0.00–0.07)
Immature Granulocytes: 0.4 %
Lymphocytes Absolute Automated: 1.61 10*3/uL (ref 0.42–3.22)
Lymphocytes Automated: 34 %
MCH: 29.6 pg (ref 25.1–33.5)
MCHC: 33.7 g/dL (ref 31.5–35.8)
MCV: 87.9 fL (ref 78.0–96.0)
MPV: 9 fL (ref 8.9–12.5)
Monocytes Absolute Automated: 0.47 10*3/uL (ref 0.21–0.85)
Monocytes: 9.9 %
Neutrophils Absolute: 2.52 10*3/uL (ref 1.10–6.33)
Neutrophils: 53.2 %
Nucleated RBC: 0 /100 WBC (ref 0.0–0.0)
Platelets: 50 10*3/uL — ABNORMAL LOW (ref 142–346)
RBC: 4.05 10*6/uL (ref 3.90–5.10)
RDW: 13 % (ref 11–15)
WBC: 4.74 10*3/uL (ref 3.10–9.50)

## 2019-07-25 LAB — COMPREHENSIVE METABOLIC PANEL
ALT: 31 U/L (ref 0–55)
AST (SGOT): 23 U/L (ref 5–34)
Albumin/Globulin Ratio: 1.6 (ref 0.9–2.2)
Albumin: 3.8 g/dL (ref 3.5–5.0)
Alkaline Phosphatase: 79 U/L (ref 50–130)
Anion Gap: 9 (ref 5.0–15.0)
BUN: 12 mg/dL (ref 7.0–19.0)
Bilirubin, Total: 0.8 mg/dL (ref 0.2–1.2)
CO2: 24 mEq/L (ref 21–29)
Calcium: 8.9 mg/dL (ref 8.5–10.5)
Chloride: 107 mEq/L (ref 100–111)
Creatinine: 0.9 mg/dL (ref 0.4–1.5)
Globulin: 2.4 g/dL (ref 2.0–3.7)
Glucose: 118 mg/dL — ABNORMAL HIGH (ref 70–100)
Potassium: 5.2 mEq/L — ABNORMAL HIGH (ref 3.5–5.1)
Protein, Total: 6.2 g/dL (ref 6.0–8.3)
Sodium: 140 mEq/L (ref 136–145)

## 2019-07-25 LAB — HEMOLYSIS INDEX: Hemolysis Index: 13 (ref 0–18)

## 2019-07-25 LAB — BILIRUBIN, DIRECT: Bilirubin Direct: 0.4 mg/dL (ref 0.0–0.5)

## 2019-07-25 LAB — GGT: GGT: 32 U/L (ref 9–36)

## 2019-07-25 LAB — MAGNESIUM: Magnesium: 1.8 mg/dL (ref 1.6–2.6)

## 2019-07-25 LAB — PHOSPHORUS: Phosphorus: 4.6 mg/dL (ref 2.3–4.7)

## 2019-07-25 LAB — TACROLIMUS LEVEL: FK506: 6.9 ng/mL (ref 5.0–20.0)

## 2019-07-29 ENCOUNTER — Other Ambulatory Visit (FREE_STANDING_LABORATORY_FACILITY): Payer: No Typology Code available for payment source

## 2019-07-29 DIAGNOSIS — Z944 Liver transplant status: Secondary | ICD-10-CM

## 2019-07-29 LAB — CBC AND DIFFERENTIAL
Absolute NRBC: 0 10*3/uL (ref 0.00–0.00)
Basophils Absolute Automated: 0.02 10*3/uL (ref 0.00–0.08)
Basophils Automated: 0.4 %
Eosinophils Absolute Automated: 0.12 10*3/uL (ref 0.00–0.44)
Eosinophils Automated: 2.6 %
Hematocrit: 35.9 % (ref 34.7–43.7)
Hgb: 12.4 g/dL (ref 11.4–14.8)
Immature Granulocytes Absolute: 0.02 10*3/uL (ref 0.00–0.07)
Immature Granulocytes: 0.4 %
Lymphocytes Absolute Automated: 1.51 10*3/uL (ref 0.42–3.22)
Lymphocytes Automated: 32.5 %
MCH: 29.3 pg (ref 25.1–33.5)
MCHC: 34.5 g/dL (ref 31.5–35.8)
MCV: 84.9 fL (ref 78.0–96.0)
MPV: 9.3 fL (ref 8.9–12.5)
Monocytes Absolute Automated: 0.31 10*3/uL (ref 0.21–0.85)
Monocytes: 6.7 %
Neutrophils Absolute: 2.67 10*3/uL (ref 1.10–6.33)
Neutrophils: 57.4 %
Nucleated RBC: 0 /100 WBC (ref 0.0–0.0)
Platelets: 57 10*3/uL — ABNORMAL LOW (ref 142–346)
RBC: 4.23 10*6/uL (ref 3.90–5.10)
RDW: 13 % (ref 11–15)
WBC: 4.65 10*3/uL (ref 3.10–9.50)

## 2019-07-29 LAB — GGT: GGT: 42 U/L — ABNORMAL HIGH (ref 9–36)

## 2019-07-29 LAB — HEMOLYSIS INDEX: Hemolysis Index: 10 (ref 0–18)

## 2019-07-29 LAB — COMPREHENSIVE METABOLIC PANEL
ALT: 47 U/L (ref 0–55)
AST (SGOT): 36 U/L — ABNORMAL HIGH (ref 5–34)
Albumin/Globulin Ratio: 1.5 (ref 0.9–2.2)
Albumin: 4.1 g/dL (ref 3.5–5.0)
Alkaline Phosphatase: 111 U/L (ref 50–130)
Anion Gap: 9 (ref 5.0–15.0)
BUN: 11 mg/dL (ref 7.0–19.0)
Bilirubin, Total: 0.9 mg/dL (ref 0.2–1.2)
CO2: 22 mEq/L (ref 21–29)
Calcium: 9.4 mg/dL (ref 8.5–10.5)
Chloride: 107 mEq/L (ref 100–111)
Creatinine: 1 mg/dL (ref 0.4–1.5)
Globulin: 2.7 g/dL (ref 2.0–3.7)
Glucose: 167 mg/dL — ABNORMAL HIGH (ref 70–100)
Potassium: 5.1 mEq/L (ref 3.5–5.1)
Protein, Total: 6.8 g/dL (ref 6.0–8.3)
Sodium: 138 mEq/L (ref 136–145)

## 2019-07-29 LAB — BILIRUBIN, DIRECT: Bilirubin Direct: 0.5 mg/dL (ref 0.0–0.5)

## 2019-07-29 LAB — MAGNESIUM: Magnesium: 1.8 mg/dL (ref 1.6–2.6)

## 2019-07-29 LAB — PHOSPHORUS: Phosphorus: 5 mg/dL — ABNORMAL HIGH (ref 2.3–4.7)

## 2019-07-29 LAB — TACROLIMUS LEVEL: FK506: 7.7 ng/mL (ref 5.0–20.0)

## 2019-08-01 ENCOUNTER — Other Ambulatory Visit (FREE_STANDING_LABORATORY_FACILITY): Payer: No Typology Code available for payment source

## 2019-08-01 DIAGNOSIS — Z944 Liver transplant status: Secondary | ICD-10-CM

## 2019-08-01 LAB — BILIRUBIN, DIRECT: Bilirubin Direct: 0.4 mg/dL (ref 0.0–0.5)

## 2019-08-01 LAB — CBC AND DIFFERENTIAL
Absolute NRBC: 0 10*3/uL (ref 0.00–0.00)
Basophils Absolute Automated: 0.01 10*3/uL (ref 0.00–0.08)
Basophils Automated: 0.3 %
Eosinophils Absolute Automated: 0.09 10*3/uL (ref 0.00–0.44)
Eosinophils Automated: 2.5 %
Hematocrit: 34.7 % (ref 34.7–43.7)
Hgb: 12 g/dL (ref 11.4–14.8)
Immature Granulocytes Absolute: 0.01 10*3/uL (ref 0.00–0.07)
Immature Granulocytes: 0.3 %
Lymphocytes Absolute Automated: 1.29 10*3/uL (ref 0.42–3.22)
Lymphocytes Automated: 35.3 %
MCH: 29.3 pg (ref 25.1–33.5)
MCHC: 34.6 g/dL (ref 31.5–35.8)
MCV: 84.8 fL (ref 78.0–96.0)
MPV: 10 fL (ref 8.9–12.5)
Monocytes Absolute Automated: 0.25 10*3/uL (ref 0.21–0.85)
Monocytes: 6.8 %
Neutrophils Absolute: 2 10*3/uL (ref 1.10–6.33)
Neutrophils: 54.8 %
Nucleated RBC: 0 /100 WBC (ref 0.0–0.0)
Platelets: 58 10*3/uL — ABNORMAL LOW (ref 142–346)
RBC: 4.09 10*6/uL (ref 3.90–5.10)
RDW: 13 % (ref 11–15)
WBC: 3.65 10*3/uL (ref 3.10–9.50)

## 2019-08-01 LAB — COMPREHENSIVE METABOLIC PANEL
ALT: 42 U/L (ref 0–55)
AST (SGOT): 27 U/L (ref 5–34)
Albumin/Globulin Ratio: 1.6 (ref 0.9–2.2)
Albumin: 4.2 g/dL (ref 3.5–5.0)
Alkaline Phosphatase: 109 U/L (ref 50–130)
Anion Gap: 7 (ref 5.0–15.0)
BUN: 13 mg/dL (ref 7.0–19.0)
Bilirubin, Total: 0.9 mg/dL (ref 0.2–1.2)
CO2: 26 mEq/L (ref 21–29)
Calcium: 9.3 mg/dL (ref 8.5–10.5)
Chloride: 106 mEq/L (ref 100–111)
Creatinine: 1 mg/dL (ref 0.4–1.5)
Globulin: 2.6 g/dL (ref 2.0–3.7)
Glucose: 177 mg/dL — ABNORMAL HIGH (ref 70–100)
Potassium: 5.2 mEq/L — ABNORMAL HIGH (ref 3.5–5.1)
Protein, Total: 6.8 g/dL (ref 6.0–8.3)
Sodium: 139 mEq/L (ref 136–145)

## 2019-08-01 LAB — GGT: GGT: 37 U/L — ABNORMAL HIGH (ref 9–36)

## 2019-08-01 LAB — TACROLIMUS LEVEL: FK506: 6.5 ng/mL (ref 5.0–20.0)

## 2019-08-01 LAB — MAGNESIUM: Magnesium: 1.9 mg/dL (ref 1.6–2.6)

## 2019-08-01 LAB — PHOSPHORUS: Phosphorus: 4.2 mg/dL (ref 2.3–4.7)

## 2019-08-01 LAB — HEMOLYSIS INDEX: Hemolysis Index: 6 (ref 0–18)

## 2019-08-03 LAB — EBV DNA, QUANTITATIVE PCR
Epstein-Barr Virus DNA Quan PCR Log Copies/ML: <2.3 (ref <2.30)
Epstein-Barr virus, DNA, QUAN PCR: <200 (ref <200)

## 2019-08-03 LAB — CYTOMEGALOVIRUS DNA, QUANTITATIVE REAL-TIME PCR
CMV DNA, QN PCR: <2.3 (ref <2.30)
CMV DNA,QN Real Time PCR: <200 (ref <200)

## 2019-08-06 ENCOUNTER — Other Ambulatory Visit (FREE_STANDING_LABORATORY_FACILITY): Payer: No Typology Code available for payment source

## 2019-08-06 DIAGNOSIS — Z944 Liver transplant status: Secondary | ICD-10-CM

## 2019-08-06 LAB — COMPREHENSIVE METABOLIC PANEL
ALT: 28 U/L (ref 0–55)
AST (SGOT): 15 U/L (ref 5–34)
Albumin/Globulin Ratio: 1.7 (ref 0.9–2.2)
Albumin: 4.2 g/dL (ref 3.5–5.0)
Alkaline Phosphatase: 114 U/L (ref 50–130)
Anion Gap: 9 (ref 5.0–15.0)
BUN: 16 mg/dL (ref 7.0–19.0)
Bilirubin, Total: 0.8 mg/dL (ref 0.2–1.2)
CO2: 22 mEq/L (ref 21–29)
Calcium: 9.5 mg/dL (ref 8.5–10.5)
Chloride: 102 mEq/L (ref 100–111)
Creatinine: 1.7 mg/dL — ABNORMAL HIGH (ref 0.4–1.5)
Globulin: 2.5 g/dL (ref 2.0–3.7)
Glucose: 443 mg/dL — ABNORMAL HIGH (ref 70–100)
Potassium: 5.9 mEq/L — ABNORMAL HIGH (ref 3.5–5.1)
Protein, Total: 6.7 g/dL (ref 6.0–8.3)
Sodium: 133 mEq/L — ABNORMAL LOW (ref 136–145)

## 2019-08-06 LAB — CBC AND DIFFERENTIAL
Absolute NRBC: 0 10*3/uL (ref 0.00–0.00)
Basophils Absolute Automated: 0.02 10*3/uL (ref 0.00–0.08)
Basophils Automated: 0.6 %
Eosinophils Absolute Automated: 0.06 10*3/uL (ref 0.00–0.44)
Eosinophils Automated: 1.9 %
Hematocrit: 31.4 % — ABNORMAL LOW (ref 34.7–43.7)
Hgb: 11.1 g/dL — ABNORMAL LOW (ref 11.4–14.8)
Immature Granulocytes Absolute: 0.01 10*3/uL (ref 0.00–0.07)
Immature Granulocytes: 0.3 %
Lymphocytes Absolute Automated: 1.13 10*3/uL (ref 0.42–3.22)
Lymphocytes Automated: 36 %
MCH: 29.5 pg (ref 25.1–33.5)
MCHC: 35.4 g/dL (ref 31.5–35.8)
MCV: 83.5 fL (ref 78.0–96.0)
MPV: 9.2 fL (ref 8.9–12.5)
Monocytes Absolute Automated: 0.17 10*3/uL — ABNORMAL LOW (ref 0.21–0.85)
Monocytes: 5.4 %
Neutrophils Absolute: 1.75 10*3/uL (ref 1.10–6.33)
Neutrophils: 55.8 %
Nucleated RBC: 0 /100 WBC (ref 0.0–0.0)
Platelets: 55 10*3/uL — ABNORMAL LOW (ref 142–346)
RBC: 3.76 10*6/uL — ABNORMAL LOW (ref 3.90–5.10)
RDW: 14 % (ref 11–15)
WBC: 3.14 10*3/uL (ref 3.10–9.50)

## 2019-08-06 LAB — PHOSPHORUS: Phosphorus: 5.3 mg/dL — ABNORMAL HIGH (ref 2.3–4.7)

## 2019-08-06 LAB — HEMOLYSIS INDEX: Hemolysis Index: 13 (ref 0–18)

## 2019-08-06 LAB — BILIRUBIN, DIRECT: Bilirubin Direct: 0.3 mg/dL (ref 0.0–0.5)

## 2019-08-06 LAB — GGT: GGT: 30 U/L (ref 9–36)

## 2019-08-06 LAB — MAGNESIUM: Magnesium: 2.1 mg/dL (ref 1.6–2.6)

## 2019-08-06 LAB — TACROLIMUS LEVEL: FK506: 10.9 ng/mL (ref 5.0–20.0)

## 2019-08-07 LAB — CYTOMEGALOVIRUS DNA, QUANTITATIVE REAL-TIME PCR
CMV DNA, QN PCR: <2.3 (ref <2.30)
CMV DNA,QN Real Time PCR: <200 (ref <200)

## 2019-08-07 LAB — EBV DNA, QUANTITATIVE PCR
Epstein-Barr Virus DNA Quan PCR Log Copies/ML: <2.3 (ref <2.30)
Epstein-Barr virus, DNA, QUAN PCR: <200 (ref <200)

## 2019-08-09 ENCOUNTER — Other Ambulatory Visit (FREE_STANDING_LABORATORY_FACILITY): Payer: No Typology Code available for payment source

## 2019-08-09 DIAGNOSIS — Z944 Liver transplant status: Secondary | ICD-10-CM

## 2019-08-09 LAB — CBC AND DIFFERENTIAL
Absolute NRBC: 0 10*3/uL (ref 0.00–0.00)
Basophils Absolute Automated: 0.01 10*3/uL (ref 0.00–0.08)
Basophils Automated: 0.4 %
Eosinophils Absolute Automated: 0.08 10*3/uL (ref 0.00–0.44)
Eosinophils Automated: 3.2 %
Hematocrit: 33 % — ABNORMAL LOW (ref 34.7–43.7)
Hgb: 11.2 g/dL — ABNORMAL LOW (ref 11.4–14.8)
Immature Granulocytes Absolute: 0 10*3/uL (ref 0.00–0.07)
Immature Granulocytes: 0 %
Lymphocytes Absolute Automated: 1.06 10*3/uL (ref 0.42–3.22)
Lymphocytes Automated: 42.1 %
MCH: 30 pg (ref 25.1–33.5)
MCHC: 33.9 g/dL (ref 31.5–35.8)
MCV: 88.5 fL (ref 78.0–96.0)
MPV: 10.1 fL (ref 8.9–12.5)
Monocytes Absolute Automated: 0.2 10*3/uL — ABNORMAL LOW (ref 0.21–0.85)
Monocytes: 7.9 %
Neutrophils Absolute: 1.17 10*3/uL (ref 1.10–6.33)
Neutrophils: 46.4 %
Nucleated RBC: 0 /100 WBC (ref 0.0–0.0)
Platelets: 44 10*3/uL — ABNORMAL LOW (ref 142–346)
RBC: 3.73 10*6/uL — ABNORMAL LOW (ref 3.90–5.10)
RDW: 15 % (ref 11–15)
WBC: 2.52 10*3/uL — ABNORMAL LOW (ref 3.10–9.50)

## 2019-08-09 LAB — COMPREHENSIVE METABOLIC PANEL
ALT: 28 U/L (ref 0–55)
AST (SGOT): 25 U/L (ref 5–34)
Albumin/Globulin Ratio: 1.7 (ref 0.9–2.2)
Albumin: 3.8 g/dL (ref 3.5–5.0)
Alkaline Phosphatase: 89 U/L (ref 50–130)
Anion Gap: 9 (ref 5.0–15.0)
BUN: 19 mg/dL (ref 7.0–19.0)
Bilirubin, Total: 0.6 mg/dL (ref 0.2–1.2)
CO2: 20 mEq/L — ABNORMAL LOW (ref 21–29)
Calcium: 9.1 mg/dL (ref 8.5–10.5)
Chloride: 112 mEq/L — ABNORMAL HIGH (ref 100–111)
Creatinine: 1.2 mg/dL (ref 0.4–1.5)
Globulin: 2.3 g/dL (ref 2.0–3.7)
Glucose: 154 mg/dL — ABNORMAL HIGH (ref 70–100)
Potassium: 6.5 mEq/L (ref 3.5–5.1)
Protein, Total: 6.1 g/dL (ref 6.0–8.3)
Sodium: 141 mEq/L (ref 136–145)

## 2019-08-09 LAB — GGT: GGT: 24 U/L (ref 9–36)

## 2019-08-09 LAB — TACROLIMUS LEVEL: FK506: 15.7 ng/mL (ref 5.0–20.0)

## 2019-08-09 LAB — PHOSPHORUS: Phosphorus: 4.8 mg/dL — ABNORMAL HIGH (ref 2.3–4.7)

## 2019-08-09 LAB — BILIRUBIN, DIRECT: Bilirubin Direct: 0.3 mg/dL (ref 0.0–0.5)

## 2019-08-09 LAB — HEMOLYSIS INDEX: Hemolysis Index: 4 (ref 0–18)

## 2019-08-09 LAB — MAGNESIUM: Magnesium: 2.1 mg/dL (ref 1.6–2.6)

## 2019-08-09 LAB — IMMATURE PLT FRACTION: Immature Platelet Fraction: 3 % (ref 0.1–8.6)

## 2019-08-14 ENCOUNTER — Encounter (INDEPENDENT_AMBULATORY_CARE_PROVIDER_SITE_OTHER): Payer: Self-pay

## 2019-08-16 ENCOUNTER — Ambulatory Visit (INDEPENDENT_AMBULATORY_CARE_PROVIDER_SITE_OTHER): Payer: No Typology Code available for payment source

## 2019-08-19 ENCOUNTER — Other Ambulatory Visit (FREE_STANDING_LABORATORY_FACILITY): Payer: No Typology Code available for payment source

## 2019-08-19 DIAGNOSIS — Z944 Liver transplant status: Secondary | ICD-10-CM

## 2019-08-19 LAB — COMPREHENSIVE METABOLIC PANEL
ALT: 36 U/L (ref 0–55)
AST (SGOT): 31 U/L (ref 5–34)
Albumin/Globulin Ratio: 2 (ref 0.9–2.2)
Albumin: 3.9 g/dL (ref 3.5–5.0)
Alkaline Phosphatase: 87 U/L (ref 50–130)
Anion Gap: 8 (ref 5.0–15.0)
BUN: 12 mg/dL (ref 7.0–19.0)
Bilirubin, Total: 0.7 mg/dL (ref 0.2–1.2)
CO2: 26 mEq/L (ref 21–29)
Calcium: 9 mg/dL (ref 8.5–10.5)
Chloride: 106 mEq/L (ref 100–111)
Creatinine: 0.8 mg/dL (ref 0.4–1.5)
Globulin: 2 g/dL (ref 2.0–3.7)
Glucose: 100 mg/dL (ref 70–100)
Potassium: 5.2 mEq/L — ABNORMAL HIGH (ref 3.5–5.1)
Protein, Total: 5.9 g/dL — ABNORMAL LOW (ref 6.0–8.3)
Sodium: 140 mEq/L (ref 136–145)

## 2019-08-19 LAB — CBC AND DIFFERENTIAL
Absolute NRBC: 0 10*3/uL (ref 0.00–0.00)
Basophils Absolute Automated: 0.01 10*3/uL (ref 0.00–0.08)
Basophils Automated: 0.4 %
Eosinophils Absolute Automated: 0.1 10*3/uL (ref 0.00–0.44)
Eosinophils Automated: 3.5 %
Hematocrit: 33.3 % — ABNORMAL LOW (ref 34.7–43.7)
Hgb: 11.5 g/dL (ref 11.4–14.8)
Immature Granulocytes Absolute: 0.02 10*3/uL (ref 0.00–0.07)
Immature Granulocytes: 0.7 %
Lymphocytes Absolute Automated: 1.18 10*3/uL (ref 0.42–3.22)
Lymphocytes Automated: 41.7 %
MCH: 30.6 pg (ref 25.1–33.5)
MCHC: 34.5 g/dL (ref 31.5–35.8)
MCV: 88.6 fL (ref 78.0–96.0)
MPV: 9.9 fL (ref 8.9–12.5)
Monocytes Absolute Automated: 0.22 10*3/uL (ref 0.21–0.85)
Monocytes: 7.8 %
Neutrophils Absolute: 1.3 10*3/uL (ref 1.10–6.33)
Neutrophils: 45.9 %
Nucleated RBC: 0 /100 WBC (ref 0.0–0.0)
Platelets: 58 10*3/uL — ABNORMAL LOW (ref 142–346)
RBC: 3.76 10*6/uL — ABNORMAL LOW (ref 3.90–5.10)
RDW: 15 % (ref 11–15)
WBC: 2.83 10*3/uL — ABNORMAL LOW (ref 3.10–9.50)

## 2019-08-19 LAB — TACROLIMUS LEVEL: FK506: 7.4 ng/mL (ref 5.0–20.0)

## 2019-08-19 LAB — PHOSPHORUS: Phosphorus: 4.8 mg/dL — ABNORMAL HIGH (ref 2.3–4.7)

## 2019-08-19 LAB — BILIRUBIN, DIRECT: Bilirubin Direct: 0.3 mg/dL (ref 0.0–0.5)

## 2019-08-19 LAB — HEMOLYSIS INDEX: Hemolysis Index: 5 (ref 0–18)

## 2019-08-19 LAB — MAGNESIUM: Magnesium: 1.8 mg/dL (ref 1.6–2.6)

## 2019-08-20 LAB — EBV DNA, QUANTITATIVE PCR
Epstein-Barr Virus DNA Quan PCR Log Copies/ML: <2.3 (ref <2.30)
Epstein-Barr virus, DNA, QUAN PCR: <200 (ref <200)

## 2019-08-20 LAB — CYTOMEGALOVIRUS DNA, QUANTITATIVE REAL-TIME PCR
CMV DNA, QN PCR: <2.3 (ref <2.30)
CMV DNA,QN Real Time PCR: <200 (ref <200)

## 2019-08-22 ENCOUNTER — Ambulatory Visit (INDEPENDENT_AMBULATORY_CARE_PROVIDER_SITE_OTHER): Payer: No Typology Code available for payment source

## 2019-08-22 ENCOUNTER — Other Ambulatory Visit (FREE_STANDING_LABORATORY_FACILITY): Payer: No Typology Code available for payment source

## 2019-08-22 DIAGNOSIS — Z944 Liver transplant status: Secondary | ICD-10-CM

## 2019-08-22 DIAGNOSIS — Z23 Encounter for immunization: Secondary | ICD-10-CM

## 2019-08-22 LAB — COMPREHENSIVE METABOLIC PANEL
ALT: 49 U/L (ref 0–55)
AST (SGOT): 42 U/L — ABNORMAL HIGH (ref 5–34)
Albumin/Globulin Ratio: 1.9 (ref 0.9–2.2)
Albumin: 4.2 g/dL (ref 3.5–5.0)
Alkaline Phosphatase: 89 U/L (ref 50–130)
Anion Gap: 10 (ref 5.0–15.0)
BUN: 11 mg/dL (ref 7.0–19.0)
Bilirubin, Total: 0.8 mg/dL (ref 0.2–1.2)
CO2: 25 mEq/L (ref 21–29)
Calcium: 9.3 mg/dL (ref 8.5–10.5)
Chloride: 104 mEq/L (ref 100–111)
Creatinine: 0.9 mg/dL (ref 0.4–1.5)
Globulin: 2.2 g/dL (ref 2.0–3.7)
Glucose: 120 mg/dL — ABNORMAL HIGH (ref 70–100)
Potassium: 5.6 mEq/L — ABNORMAL HIGH (ref 3.5–5.1)
Protein, Total: 6.4 g/dL (ref 6.0–8.3)
Sodium: 139 mEq/L (ref 136–145)

## 2019-08-22 LAB — CBC AND DIFFERENTIAL
Absolute NRBC: 0 10*3/uL (ref 0.00–0.00)
Basophils Absolute Automated: 0.02 10*3/uL (ref 0.00–0.08)
Basophils Automated: 0.7 %
Eosinophils Absolute Automated: 0.09 10*3/uL (ref 0.00–0.44)
Eosinophils Automated: 3.1 %
Hematocrit: 35.3 % (ref 34.7–43.7)
Hgb: 12.2 g/dL (ref 11.4–14.8)
Immature Granulocytes Absolute: 0.02 10*3/uL (ref 0.00–0.07)
Immature Granulocytes: 0.7 %
Lymphocytes Absolute Automated: 1.2 10*3/uL (ref 0.42–3.22)
Lymphocytes Automated: 41 %
MCH: 30.5 pg (ref 25.1–33.5)
MCHC: 34.6 g/dL (ref 31.5–35.8)
MCV: 88.3 fL (ref 78.0–96.0)
MPV: 10 fL (ref 8.9–12.5)
Monocytes Absolute Automated: 0.25 10*3/uL (ref 0.21–0.85)
Monocytes: 8.5 %
Neutrophils Absolute: 1.35 10*3/uL (ref 1.10–6.33)
Neutrophils: 46 %
Nucleated RBC: 0 /100 WBC (ref 0.0–0.0)
Platelets: 55 10*3/uL — ABNORMAL LOW (ref 142–346)
RBC: 4 10*6/uL (ref 3.90–5.10)
RDW: 15 % (ref 11–15)
WBC: 2.93 10*3/uL — ABNORMAL LOW (ref 3.10–9.50)

## 2019-08-22 LAB — TACROLIMUS LEVEL: FK506: 6.8 ng/mL (ref 5.0–20.0)

## 2019-08-22 LAB — MAGNESIUM: Magnesium: 1.9 mg/dL (ref 1.6–2.6)

## 2019-08-22 LAB — GGT: GGT: 23 U/L (ref 9–36)

## 2019-08-22 LAB — BILIRUBIN, DIRECT: Bilirubin Direct: 0.4 mg/dL (ref 0.0–0.5)

## 2019-08-22 LAB — PHOSPHORUS: Phosphorus: 5.1 mg/dL — ABNORMAL HIGH (ref 2.3–4.7)

## 2019-08-22 LAB — HEMOLYSIS INDEX: Hemolysis Index: 9 (ref 0–18)

## 2019-08-27 ENCOUNTER — Other Ambulatory Visit (FREE_STANDING_LABORATORY_FACILITY): Payer: No Typology Code available for payment source

## 2019-08-27 DIAGNOSIS — Z940 Kidney transplant status: Secondary | ICD-10-CM

## 2019-08-27 LAB — CBC AND DIFFERENTIAL
Absolute NRBC: 0 10*3/uL (ref 0.00–0.00)
Basophils Absolute Automated: 0.02 10*3/uL (ref 0.00–0.08)
Basophils Automated: 0.6 %
Eosinophils Absolute Automated: 0.1 10*3/uL (ref 0.00–0.44)
Eosinophils Automated: 3.1 %
Hematocrit: 34.2 % — ABNORMAL LOW (ref 34.7–43.7)
Hgb: 11.5 g/dL (ref 11.4–14.8)
Immature Granulocytes Absolute: 0 10*3/uL (ref 0.00–0.07)
Immature Granulocytes: 0 %
Lymphocytes Absolute Automated: 0.99 10*3/uL (ref 0.42–3.22)
Lymphocytes Automated: 30.8 %
MCH: 30.6 pg (ref 25.1–33.5)
MCHC: 33.6 g/dL (ref 31.5–35.8)
MCV: 91 fL (ref 78.0–96.0)
MPV: 10.3 fL (ref 8.9–12.5)
Monocytes Absolute Automated: 0.14 10*3/uL — ABNORMAL LOW (ref 0.21–0.85)
Monocytes: 4.4 %
Neutrophils Absolute: 1.96 10*3/uL (ref 1.10–6.33)
Neutrophils: 61.1 %
Nucleated RBC: 0 /100 WBC (ref 0.0–0.0)
Platelets: 51 10*3/uL — ABNORMAL LOW (ref 142–346)
RBC: 3.76 10*6/uL — ABNORMAL LOW (ref 3.90–5.10)
RDW: 15 % (ref 11–15)
WBC: 3.21 10*3/uL (ref 3.10–9.50)

## 2019-08-27 LAB — COMPREHENSIVE METABOLIC PANEL
ALT: 38 U/L (ref 0–55)
AST (SGOT): 34 U/L (ref 5–34)
Albumin/Globulin Ratio: 1.7 (ref 0.9–2.2)
Albumin: 3.8 g/dL (ref 3.5–5.0)
Alkaline Phosphatase: 87 U/L (ref 50–130)
Anion Gap: 6 (ref 5.0–15.0)
BUN: 8 mg/dL (ref 7.0–19.0)
Bilirubin, Total: 0.7 mg/dL (ref 0.2–1.2)
CO2: 26 mEq/L (ref 21–29)
Calcium: 9 mg/dL (ref 8.5–10.5)
Chloride: 108 mEq/L (ref 100–111)
Creatinine: 0.8 mg/dL (ref 0.4–1.5)
Globulin: 2.3 g/dL (ref 2.0–3.7)
Glucose: 115 mg/dL — ABNORMAL HIGH (ref 70–100)
Potassium: 5.7 mEq/L — ABNORMAL HIGH (ref 3.5–5.1)
Protein, Total: 6.1 g/dL (ref 6.0–8.3)
Sodium: 140 mEq/L (ref 136–145)

## 2019-08-27 LAB — GGT: GGT: 32 U/L (ref 9–36)

## 2019-08-27 LAB — HEMOLYSIS INDEX: Hemolysis Index: 8 (ref 0–18)

## 2019-08-27 LAB — TACROLIMUS LEVEL: FK506: 5.8 ng/mL (ref 5.0–20.0)

## 2019-08-27 LAB — BILIRUBIN, DIRECT: Bilirubin Direct: 0.3 mg/dL (ref 0.0–0.5)

## 2019-08-27 LAB — PHOSPHORUS: Phosphorus: 5.4 mg/dL — ABNORMAL HIGH (ref 2.3–4.7)

## 2019-08-27 LAB — MAGNESIUM: Magnesium: 1.6 mg/dL (ref 1.6–2.6)

## 2019-08-28 LAB — EBV DNA, QUANTITATIVE PCR
Epstein-Barr Virus DNA Quan PCR Log Copies/ML: <2.3 (ref <2.30)
Epstein-Barr virus, DNA, QUAN PCR: <200 (ref <200)

## 2019-08-28 LAB — CYTOMEGALOVIRUS DNA, QUANTITATIVE REAL-TIME PCR
CMV DNA, QN PCR: <2.3 (ref <2.30)
CMV DNA,QN Real Time PCR: <200 (ref <200)

## 2019-08-30 ENCOUNTER — Other Ambulatory Visit (FREE_STANDING_LABORATORY_FACILITY): Payer: No Typology Code available for payment source

## 2019-08-30 DIAGNOSIS — Z944 Liver transplant status: Secondary | ICD-10-CM

## 2019-08-30 LAB — CBC AND DIFFERENTIAL
Absolute NRBC: 0 10*3/uL (ref 0.00–0.00)
Basophils Absolute Automated: 0.03 10*3/uL (ref 0.00–0.08)
Basophils Automated: 0.9 %
Eosinophils Absolute Automated: 0.09 10*3/uL (ref 0.00–0.44)
Eosinophils Automated: 2.7 %
Hematocrit: 37.9 % (ref 34.7–43.7)
Hgb: 12.7 g/dL (ref 11.4–14.8)
Immature Granulocytes Absolute: 0.01 10*3/uL (ref 0.00–0.07)
Immature Granulocytes: 0.3 %
Lymphocytes Absolute Automated: 1.06 10*3/uL (ref 0.42–3.22)
Lymphocytes Automated: 32.3 %
MCH: 30.4 pg (ref 25.1–33.5)
MCHC: 33.5 g/dL (ref 31.5–35.8)
MCV: 90.7 fL (ref 78.0–96.0)
MPV: 10.4 fL (ref 8.9–12.5)
Monocytes Absolute Automated: 0.15 10*3/uL — ABNORMAL LOW (ref 0.21–0.85)
Monocytes: 4.6 %
Neutrophils Absolute: 1.94 10*3/uL (ref 1.10–6.33)
Neutrophils: 59.2 %
Nucleated RBC: 0 /100 WBC (ref 0.0–0.0)
Platelets: 52 10*3/uL — ABNORMAL LOW (ref 142–346)
RBC: 4.18 10*6/uL (ref 3.90–5.10)
RDW: 15 % (ref 11–15)
WBC: 3.28 10*3/uL (ref 3.10–9.50)

## 2019-08-30 LAB — COMPREHENSIVE METABOLIC PANEL
ALT: 46 U/L (ref 0–55)
AST (SGOT): 36 U/L — ABNORMAL HIGH (ref 5–34)
Albumin/Globulin Ratio: 1.6 (ref 0.9–2.2)
Albumin: 4.2 g/dL (ref 3.5–5.0)
Alkaline Phosphatase: 95 U/L (ref 50–130)
Anion Gap: 11 (ref 5.0–15.0)
BUN: 10 mg/dL (ref 7.0–19.0)
Bilirubin, Total: 0.8 mg/dL (ref 0.2–1.2)
CO2: 24 mEq/L (ref 21–29)
Calcium: 9.6 mg/dL (ref 8.5–10.5)
Chloride: 104 mEq/L (ref 100–111)
Creatinine: 0.9 mg/dL (ref 0.4–1.5)
Globulin: 2.6 g/dL (ref 2.0–3.7)
Glucose: 137 mg/dL — ABNORMAL HIGH (ref 70–100)
Potassium: 5.5 mEq/L — ABNORMAL HIGH (ref 3.5–5.1)
Protein, Total: 6.8 g/dL (ref 6.0–8.3)
Sodium: 139 mEq/L (ref 136–145)

## 2019-08-30 LAB — PHOSPHORUS: Phosphorus: 5 mg/dL — ABNORMAL HIGH (ref 2.3–4.7)

## 2019-08-30 LAB — MAGNESIUM: Magnesium: 1.7 mg/dL (ref 1.6–2.6)

## 2019-08-30 LAB — TACROLIMUS LEVEL: FK506: 6.2 ng/mL (ref 5.0–20.0)

## 2019-08-30 LAB — GGT: GGT: 33 U/L (ref 9–36)

## 2019-08-30 LAB — HEMOLYSIS INDEX: Hemolysis Index: 10 (ref 0–18)

## 2019-08-30 LAB — BILIRUBIN, DIRECT: Bilirubin Direct: 0.4 mg/dL (ref 0.0–0.5)

## 2019-09-02 ENCOUNTER — Other Ambulatory Visit (FREE_STANDING_LABORATORY_FACILITY): Payer: No Typology Code available for payment source

## 2019-09-02 DIAGNOSIS — Z944 Liver transplant status: Secondary | ICD-10-CM

## 2019-09-02 LAB — CBC AND DIFFERENTIAL
Absolute NRBC: 0 10*3/uL (ref 0.00–0.00)
Basophils Absolute Automated: 0.02 10*3/uL (ref 0.00–0.08)
Basophils Automated: 0.7 %
Eosinophils Absolute Automated: 0.07 10*3/uL (ref 0.00–0.44)
Eosinophils Automated: 2.6 %
Hematocrit: 35.5 % (ref 34.7–43.7)
Hgb: 12.2 g/dL (ref 11.4–14.8)
Immature Granulocytes Absolute: 0 10*3/uL (ref 0.00–0.07)
Immature Granulocytes: 0 %
Lymphocytes Absolute Automated: 0.96 10*3/uL (ref 0.42–3.22)
Lymphocytes Automated: 35.4 %
MCH: 30.7 pg (ref 25.1–33.5)
MCHC: 34.4 g/dL (ref 31.5–35.8)
MCV: 89.4 fL (ref 78.0–96.0)
MPV: 10 fL (ref 8.9–12.5)
Monocytes Absolute Automated: 0.2 10*3/uL — ABNORMAL LOW (ref 0.21–0.85)
Monocytes: 7.4 %
Neutrophils Absolute: 1.46 10*3/uL (ref 1.10–6.33)
Neutrophils: 53.9 %
Nucleated RBC: 0 /100 WBC (ref 0.0–0.0)
Platelets: 56 10*3/uL — ABNORMAL LOW (ref 142–346)
RBC: 3.97 10*6/uL (ref 3.90–5.10)
RDW: 15 % (ref 11–15)
WBC: 2.71 10*3/uL — ABNORMAL LOW (ref 3.10–9.50)

## 2019-09-02 LAB — MAGNESIUM: Magnesium: 1.6 mg/dL (ref 1.6–2.6)

## 2019-09-02 LAB — PHOSPHORUS: Phosphorus: 4.7 mg/dL (ref 2.3–4.7)

## 2019-09-02 LAB — COMPREHENSIVE METABOLIC PANEL
ALT: 39 U/L (ref 0–55)
AST (SGOT): 32 U/L (ref 5–34)
Albumin/Globulin Ratio: 1.5 (ref 0.9–2.2)
Albumin: 4 g/dL (ref 3.5–5.0)
Alkaline Phosphatase: 91 U/L (ref 50–130)
Anion Gap: 10 (ref 5.0–15.0)
BUN: 11 mg/dL (ref 7.0–19.0)
Bilirubin, Total: 0.8 mg/dL (ref 0.2–1.2)
CO2: 24 mEq/L (ref 21–29)
Calcium: 9.2 mg/dL (ref 8.5–10.5)
Chloride: 105 mEq/L (ref 100–111)
Creatinine: 0.9 mg/dL (ref 0.4–1.5)
Globulin: 2.6 g/dL (ref 2.0–3.7)
Glucose: 136 mg/dL — ABNORMAL HIGH (ref 70–100)
Potassium: 4.8 mEq/L (ref 3.5–5.1)
Protein, Total: 6.6 g/dL (ref 6.0–8.3)
Sodium: 139 mEq/L (ref 136–145)

## 2019-09-02 LAB — TACROLIMUS LEVEL: FK506: 4.7 ng/mL — ABNORMAL LOW (ref 5.0–20.0)

## 2019-09-02 LAB — BILIRUBIN, DIRECT: Bilirubin Direct: 0.4 mg/dL (ref 0.0–0.5)

## 2019-09-02 LAB — GGT: GGT: 41 U/L — ABNORMAL HIGH (ref 9–36)

## 2019-09-02 LAB — HEMOLYSIS INDEX: Hemolysis Index: 6 (ref 0–18)

## 2019-09-04 LAB — CYTOMEGALOVIRUS DNA, QUANTITATIVE REAL-TIME PCR
CMV DNA, QN PCR: <2.3 (ref <2.30)
CMV DNA,QN Real Time PCR: <200 (ref <200)

## 2019-09-04 LAB — EBV DNA, QUANTITATIVE PCR
Epstein-Barr Virus DNA Quan PCR Log Copies/ML: 2.51 — ABNORMAL HIGH (ref <2.30)
Epstein-Barr virus, DNA, QUAN PCR: 324 — ABNORMAL HIGH (ref <200)

## 2019-09-05 ENCOUNTER — Other Ambulatory Visit (FREE_STANDING_LABORATORY_FACILITY): Payer: No Typology Code available for payment source

## 2019-09-05 DIAGNOSIS — Z944 Liver transplant status: Secondary | ICD-10-CM

## 2019-09-05 LAB — CBC AND DIFFERENTIAL
Absolute NRBC: 0 10*3/uL (ref 0.00–0.00)
Basophils Absolute Automated: 0.02 10*3/uL (ref 0.00–0.08)
Basophils Automated: 0.6 %
Eosinophils Absolute Automated: 0.12 10*3/uL (ref 0.00–0.44)
Eosinophils Automated: 3.5 %
Hematocrit: 36.8 % (ref 34.7–43.7)
Hgb: 12.7 g/dL (ref 11.4–14.8)
Immature Granulocytes Absolute: 0.01 10*3/uL (ref 0.00–0.07)
Immature Granulocytes: 0.3 %
Lymphocytes Absolute Automated: 1.24 10*3/uL (ref 0.42–3.22)
Lymphocytes Automated: 36 %
MCH: 30.8 pg (ref 25.1–33.5)
MCHC: 34.5 g/dL (ref 31.5–35.8)
MCV: 89.3 fL (ref 78.0–96.0)
MPV: 10.5 fL (ref 8.9–12.5)
Monocytes Absolute Automated: 0.28 10*3/uL (ref 0.21–0.85)
Monocytes: 8.1 %
Neutrophils Absolute: 1.77 10*3/uL (ref 1.10–6.33)
Neutrophils: 51.5 %
Nucleated RBC: 0 /100 WBC (ref 0.0–0.0)
Platelets: 67 10*3/uL — ABNORMAL LOW (ref 142–346)
RBC: 4.12 10*6/uL (ref 3.90–5.10)
RDW: 14 % (ref 11–15)
WBC: 3.44 10*3/uL (ref 3.10–9.50)

## 2019-09-05 LAB — COMPREHENSIVE METABOLIC PANEL
ALT: 40 U/L (ref 0–55)
AST (SGOT): 27 U/L (ref 5–34)
Albumin/Globulin Ratio: 1.6 (ref 0.9–2.2)
Albumin: 4.2 g/dL (ref 3.5–5.0)
Alkaline Phosphatase: 92 U/L (ref 50–130)
Anion Gap: 9 (ref 5.0–15.0)
BUN: 13 mg/dL (ref 7.0–19.0)
Bilirubin, Total: 1 mg/dL (ref 0.2–1.2)
CO2: 26 mEq/L (ref 21–29)
Calcium: 9.6 mg/dL (ref 8.5–10.5)
Chloride: 105 mEq/L (ref 100–111)
Creatinine: 1 mg/dL (ref 0.4–1.5)
Globulin: 2.6 g/dL (ref 2.0–3.7)
Glucose: 140 mg/dL — ABNORMAL HIGH (ref 70–100)
Potassium: 5.2 mEq/L — ABNORMAL HIGH (ref 3.5–5.1)
Protein, Total: 6.8 g/dL (ref 6.0–8.3)
Sodium: 140 mEq/L (ref 136–145)

## 2019-09-05 LAB — HEMOLYSIS INDEX: Hemolysis Index: 7 (ref 0–18)

## 2019-09-05 LAB — GGT: GGT: 30 U/L (ref 9–36)

## 2019-09-05 LAB — PHOSPHORUS: Phosphorus: 4.9 mg/dL — ABNORMAL HIGH (ref 2.3–4.7)

## 2019-09-05 LAB — BILIRUBIN, DIRECT: Bilirubin Direct: 0.4 mg/dL (ref 0.0–0.5)

## 2019-09-05 LAB — TACROLIMUS LEVEL: FK506: 6.3 ng/mL (ref 5.0–20.0)

## 2019-09-05 LAB — MAGNESIUM: Magnesium: 1.6 mg/dL (ref 1.6–2.6)

## 2019-09-09 ENCOUNTER — Other Ambulatory Visit (FREE_STANDING_LABORATORY_FACILITY): Payer: No Typology Code available for payment source

## 2019-09-09 DIAGNOSIS — Z944 Liver transplant status: Secondary | ICD-10-CM

## 2019-09-09 LAB — CBC AND DIFFERENTIAL
Absolute NRBC: 0 10*3/uL (ref 0.00–0.00)
Basophils Absolute Automated: 0.02 10*3/uL (ref 0.00–0.08)
Basophils Automated: 0.8 %
Eosinophils Absolute Automated: 0.12 10*3/uL (ref 0.00–0.44)
Eosinophils Automated: 4.5 %
Hematocrit: 35.9 % (ref 34.7–43.7)
Hgb: 12.5 g/dL (ref 11.4–14.8)
Immature Granulocytes Absolute: 0 10*3/uL (ref 0.00–0.07)
Immature Granulocytes: 0 %
Lymphocytes Absolute Automated: 1.03 10*3/uL (ref 0.42–3.22)
Lymphocytes Automated: 38.9 %
MCH: 30.9 pg (ref 25.1–33.5)
MCHC: 34.8 g/dL (ref 31.5–35.8)
MCV: 88.6 fL (ref 78.0–96.0)
MPV: 10.6 fL (ref 8.9–12.5)
Monocytes Absolute Automated: 0.2 10*3/uL — ABNORMAL LOW (ref 0.21–0.85)
Monocytes: 7.5 %
Neutrophils Absolute: 1.28 10*3/uL (ref 1.10–6.33)
Neutrophils: 48.3 %
Nucleated RBC: 0 /100 WBC (ref 0.0–0.0)
Platelets: 59 10*3/uL — ABNORMAL LOW (ref 142–346)
RBC: 4.05 10*6/uL (ref 3.90–5.10)
RDW: 14 % (ref 11–15)
WBC: 2.65 10*3/uL — ABNORMAL LOW (ref 3.10–9.50)

## 2019-09-09 LAB — COMPREHENSIVE METABOLIC PANEL
ALT: 49 U/L (ref 0–55)
AST (SGOT): 38 U/L — ABNORMAL HIGH (ref 5–34)
Albumin/Globulin Ratio: 1.6 (ref 0.9–2.2)
Albumin: 4.2 g/dL (ref 3.5–5.0)
Alkaline Phosphatase: 99 U/L (ref 50–130)
Anion Gap: 12 (ref 5.0–15.0)
BUN: 13 mg/dL (ref 7.0–19.0)
Bilirubin, Total: 0.8 mg/dL (ref 0.2–1.2)
CO2: 26 mEq/L (ref 21–29)
Calcium: 9.6 mg/dL (ref 8.5–10.5)
Chloride: 102 mEq/L (ref 100–111)
Creatinine: 0.9 mg/dL (ref 0.4–1.5)
Globulin: 2.7 g/dL (ref 2.0–3.7)
Glucose: 120 mg/dL — ABNORMAL HIGH (ref 70–100)
Potassium: 5.2 mEq/L — ABNORMAL HIGH (ref 3.5–5.1)
Protein, Total: 6.9 g/dL (ref 6.0–8.3)
Sodium: 140 mEq/L (ref 136–145)

## 2019-09-09 LAB — GGT: GGT: 42 U/L — ABNORMAL HIGH (ref 9–36)

## 2019-09-09 LAB — MAGNESIUM: Magnesium: 1.8 mg/dL (ref 1.6–2.6)

## 2019-09-09 LAB — TACROLIMUS LEVEL: FK506: 7.5 ng/mL (ref 5.0–20.0)

## 2019-09-09 LAB — PHOSPHORUS: Phosphorus: 5.7 mg/dL — ABNORMAL HIGH (ref 2.3–4.7)

## 2019-09-09 LAB — BILIRUBIN, DIRECT: Bilirubin Direct: 0.3 mg/dL (ref 0.0–0.5)

## 2019-09-09 LAB — HEMOLYSIS INDEX: Hemolysis Index: 5 (ref 0–18)

## 2019-09-10 ENCOUNTER — Ambulatory Visit (INDEPENDENT_AMBULATORY_CARE_PROVIDER_SITE_OTHER): Payer: No Typology Code available for payment source | Admitting: Pediatric Pulmonology

## 2019-09-10 DIAGNOSIS — E0800 Diabetes mellitus due to underlying condition with hyperosmolarity without nonketotic hyperglycemic-hyperosmolar coma (NKHHC): Secondary | ICD-10-CM

## 2019-09-10 DIAGNOSIS — K8689 Other specified diseases of pancreas: Secondary | ICD-10-CM

## 2019-09-10 DIAGNOSIS — D849 Immunodeficiency, unspecified: Secondary | ICD-10-CM

## 2019-09-10 DIAGNOSIS — Z794 Long term (current) use of insulin: Secondary | ICD-10-CM

## 2019-09-10 LAB — CYTOMEGALOVIRUS DNA, QUANTITATIVE REAL-TIME PCR
CMV DNA, QN PCR: <2.3 (ref <2.30)
CMV DNA,QN Real Time PCR: <200 (ref <200)

## 2019-09-10 LAB — EBV DNA, QUANTITATIVE PCR
Epstein-Barr Virus DNA Quan PCR Log Copies/ML: 3.47 — ABNORMAL HIGH (ref <2.30)
Epstein-Barr virus, DNA, QUAN PCR: 2961 — ABNORMAL HIGH (ref <200)

## 2019-09-10 NOTE — Progress Notes (Signed)
[facility] [place]  PEDIATRIC PULMONOLOGY OUTPATIENT VISIT    Patient Name: Alison Boone  Primary Care Physician: Dr. Lendell Caprice, Suzi Roots, MD  Referring Physician: Dr. Bonnetta Barry ref. provider found    Patient Complaint:   Cystic Fibrosis    History of Present Illness:   Alison Boone is a 19 y.o. 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  She has been hospitalized since her transplant for weight loss and electrolyte imbalance - namely a high potassium. Her weight nadir was 85 lbs. She is doing much better now and has regained some of the weight - her K has also been stable at 5.2.  She is having labs checked twice weekly, and has an appointment tomorrow with GI team at [facility]. The function of the transplanted liver has been very good and she is on full Trikafta dosing now. She reports no cough or shortness of breath. SHe has continued to do her nebs and chest PT twice daily. Her blood sugar is also under good control and ranges from 120 - 230.    She has also started PT sessions because of kyphosis.     [name] is accompanied by mother but both provided the history.    Allergies:     Allergies   Allergen Reactions   . Pistachio [Tree Nuts] Rash       Medication:     Current Outpatient Medications   Medication Sig Dispense Refill   . albuterol-ipratropium (DUO-NEB) 2.5-0.5(3) mg/3 mL nebulizer Take 3 mLs by nebulization 2 (two) times daily as needed (as needed per sick plan) 90 mL 5   . cetirizine (ZYRTEC) 5 MG tablet Take 10 mg by mouth daily        . dornase alpha (PULMOZYME) 1 MG/ML nebulizer solution Inhale 2.5 mg into the lungs.     . furosemide (LASIX) 20 MG tablet Take 20 mg by mouth 2 (two) times daily 20mg  in the am and 10mg  in the pm     . insulin aspart (NOVOLOG) 100 UNIT/ML injection Inject into the skin 3 (three) times daily before meals.     . insulin glargine (LANTUS) 100 UNIT/ML injection Inject 7 Units into the skin daily.     Marland Kitchen levoFLOXacin  (LEVAQUIN) 500 MG/100ML Solution Administer 500 mg every 24 hours for 14 days 1400 mL 0   . Multiple Vitamins-Minerals (AQUADEKS PO) Take 2 tablets by mouth.     [name] VERIO test strip TEST 6 TIMES PER DAY  6   . pancrelipase, Lip-Prot-Amyl, (CREON) 24000-76000 units Cap DR Particles Take 2 capsules of 24,000 units of lipase by mouth with meals and snacks. 300 capsule 5   . pantoprazole (PROTONIX) 40 MG tablet Take 40 mg by mouth daily     . sodium chloride 7 % Nebu Soln Take 4 mLs by nebulization 2 (two) times daily. 240 mL 5   . spironolactone (ALDACTONE) 100 MG tablet Take 50 mg by mouth 2 (two) times daily        . vitamin D (CHOLECALCIFEROL) 400 UNIT tablet Take 400 Units by mouth daily.     . vitamin E (vitamin E) 1000 UNIT capsule Take 1,000 Units by mouth daily       No current facility-administered medications for this visit.       Past Medical History:     Past Medical History:   Diagnosis Date   . Cystic fibrosis    .  Disease of lung    . Disorder of liver        Past Medical History personally reviewed  Past Surgical History:     Past Surgical History:   Procedure Laterality Date   . ORIF DISTAL RADIUS FRACTURE     . ORIF, FOREARM Right 10/10/2013    Procedure: ORIF, FOREARM;  Surgeon: Briscoe Deutscher, MD;  Location: [facility] OR;  Service: Orthopedics;  Laterality: Right;  RIGHT FOREARM OPEN REDUCTION INTERNAL FIXATION  W/ SYNTHES ELASTIC NAIL   . REMOVAL HARDWARE UPPER EXTREMITY MINOR LESS THAN 1 HOUR Right 12/12/2013    Procedure: REMOVAL HARDWARE UPPER EXTREMITY MINOR LESS THAN 1 HOUR;  Surgeon: Briscoe Deutscher, MD;  Location: [facility] OR;  Service: Orthopedics;  Laterality: Right;  RIGHT RADIUS ROD REMOVAL       Family History:   No family history on file.  Family history personally reviewed  Social History:     Social History     Socioeconomic History   . Marital status: Single     Spouse name: Not on file   . Number of children: Not on file   . Years of education: Not on file   . Highest  education level: Not on file   Occupational History   . Not on file   Tobacco Use   . Smoking status: Never Smoker   Substance and Sexual Activity   . Alcohol use: Not on file   . Drug use: Not on file   . Sexual activity: Not on file   Other Topics Concern   . Not on file   Social History Narrative   . Not on file     Social Determinants of Health     Financial Resource Strain:    . Difficulty of Paying Living Expenses:    Food Insecurity:    . Worried About Programme researcher, broadcasting/film/video in the Last Year:    . Barista in the Last Year:    Transportation Needs:    . Freight forwarder (Medical):    Marland Kitchen Lack of Transportation (Non-Medical):    Physical Activity:    . Days of Exercise per Week:    . Minutes of Exercise per Session:    Stress:    . Feeling of Stress :    Social Connections:    . Frequency of Communication with Friends and Family:    . Frequency of Social Gatherings with Friends and Family:    . Attends Religious Services:    . Active Member of Clubs or Organizations:    . Attends Banker Meetings:    Marland Kitchen Marital Status:    Intimate Partner Violence:    . Fear of Current or Ex-Partner:    . Emotionally Abused:    Marland Kitchen Physically Abused:    . Sexually Abused:      Social history reviewed and updated  Review of Systems:     On review of systems there were no other pulmonary symptoms. Her sleep pattern was normal. Her appetite was normal with normal bowel movements (1-2/day). There were no complaints of reflux. Growth and development were normal. There were no complaints referable to her cardiovascular, genitourinary, or neurological systems.   Endocrine: As described above.       Physical Exam:     Vitals:    09/10/19 1044   BP: 134/90   BP Site: Right arm   Patient Position: Sitting   Cuff  Size: Large   Pulse: 103   Temp: 98.1 F (36.7 C)   TempSrc: Temporal   SpO2: 100%   Weight: 47.1 kg (103 lb 13.4 oz)   Height: 1.598 m (5' 2.91")       Physical Exam  General: Well developed and thin.  Not  jaundiced!  HEENT: Head: normocephalic  Ears: tympanic membranes normal bilaterally  Hard palate: slightly high    Soft palate: long  Mallampati class: 2-3  Tonsils: not visualized  Neck: supple, no palpable cervical lymphadenopathy  Chest: symmetric with a normal AP diameter and no accessory muscle use. Equal good aeration bilaterally with minimal crackles on left side posteriorly - exam significantly improved from previously.  Cardiac: S1 and S2 with no murmurs  Abdomen: soft with large scar extending across, non-tender,    Extremities: trace digital clubbing - improved from previously  Musculoskeletal: back midline but has kyphosis  Neurological: grossly non-focal  Psychiatric: normal affect  Skin: clear and dry    Labs:        Rads:        Spirometry:    Mild lower air obstruction     Assessment and Recommendations:   Heather is a 19 y.o. 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      Teleconferenced with nutritionist - will continue current enzyme dosage and encouraged to use supplements - she will make her own to avoid high sugar loads.     From pulmonary standpoint she is doing well. 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 Maltophilia.     Will check culture today and decide on need for future antibiotics - e.g. inhaled Cayston as she has not cultured pseudomonas for few years. Have asked family to discuss with [facility] team "permissible" antibiotics in view of current immunosuppression.     FUP: 3 months                   Earlean Shawl, MD  Pediatric Pulmonology Physician  [facility]  Dept: [phone]    09/10/2019 5:50 PM

## 2019-09-16 ENCOUNTER — Other Ambulatory Visit (FREE_STANDING_LABORATORY_FACILITY): Payer: No Typology Code available for payment source

## 2019-09-16 DIAGNOSIS — Z944 Liver transplant status: Secondary | ICD-10-CM

## 2019-09-16 LAB — TACROLIMUS LEVEL: FK506: 5.6 ng/mL (ref 5.0–20.0)

## 2019-09-16 LAB — COMPREHENSIVE METABOLIC PANEL
ALT: 42 U/L (ref 0–55)
AST (SGOT): 38 U/L — ABNORMAL HIGH (ref 5–34)
Albumin/Globulin Ratio: 1.4 (ref 0.9–2.2)
Albumin: 4.2 g/dL (ref 3.5–5.0)
Alkaline Phosphatase: 105 U/L (ref 50–130)
Anion Gap: 8 (ref 5.0–15.0)
BUN: 9 mg/dL (ref 7.0–19.0)
Bilirubin, Total: 0.9 mg/dL (ref 0.2–1.2)
CO2: 26 mEq/L (ref 21–29)
Calcium: 9.6 mg/dL (ref 8.5–10.5)
Chloride: 103 mEq/L (ref 100–111)
Creatinine: 0.9 mg/dL (ref 0.4–1.5)
Globulin: 2.9 g/dL (ref 2.0–3.7)
Glucose: 148 mg/dL — ABNORMAL HIGH (ref 70–100)
Potassium: 5 mEq/L (ref 3.5–5.1)
Protein, Total: 7.1 g/dL (ref 6.0–8.3)
Sodium: 137 mEq/L (ref 136–145)

## 2019-09-16 LAB — CBC AND DIFFERENTIAL
Absolute NRBC: 0 10*3/uL (ref 0.00–0.00)
Basophils Absolute Automated: 0.01 10*3/uL (ref 0.00–0.08)
Basophils Automated: 0.3 %
Eosinophils Absolute Automated: 0.11 10*3/uL (ref 0.00–0.44)
Eosinophils Automated: 3.4 %
Hematocrit: 34.4 % — ABNORMAL LOW (ref 34.7–43.7)
Hgb: 12.2 g/dL (ref 11.4–14.8)
Immature Granulocytes Absolute: 0.01 10*3/uL (ref 0.00–0.07)
Immature Granulocytes: 0.3 %
Lymphocytes Absolute Automated: 1.29 10*3/uL (ref 0.42–3.22)
Lymphocytes Automated: 39.9 %
MCH: 30 pg (ref 25.1–33.5)
MCHC: 35.5 g/dL (ref 31.5–35.8)
MCV: 84.5 fL (ref 78.0–96.0)
MPV: 9.9 fL (ref 8.9–12.5)
Monocytes Absolute Automated: 0.22 10*3/uL (ref 0.21–0.85)
Monocytes: 6.8 %
Neutrophils Absolute: 1.59 10*3/uL (ref 1.10–6.33)
Neutrophils: 49.3 %
Nucleated RBC: 0 /100 WBC (ref 0.0–0.0)
Platelets: 68 10*3/uL — ABNORMAL LOW (ref 142–346)
RBC: 4.07 10*6/uL (ref 3.90–5.10)
RDW: 13 % (ref 11–15)
WBC: 3.23 10*3/uL (ref 3.10–9.50)

## 2019-09-16 LAB — MAGNESIUM: Magnesium: 1.8 mg/dL (ref 1.6–2.6)

## 2019-09-16 LAB — HEMOLYSIS INDEX: Hemolysis Index: 6 (ref 0–18)

## 2019-09-16 LAB — PHOSPHORUS: Phosphorus: 4.2 mg/dL (ref 2.3–4.7)

## 2019-09-16 LAB — GGT: GGT: 42 U/L — ABNORMAL HIGH (ref 9–36)

## 2019-09-16 LAB — BILIRUBIN, DIRECT: Bilirubin Direct: 0.4 mg/dL (ref 0.0–0.5)

## 2019-09-19 ENCOUNTER — Other Ambulatory Visit (FREE_STANDING_LABORATORY_FACILITY): Payer: No Typology Code available for payment source

## 2019-09-19 DIAGNOSIS — Z944 Liver transplant status: Secondary | ICD-10-CM

## 2019-09-19 LAB — COMPREHENSIVE METABOLIC PANEL
ALT: 50 U/L (ref 0–55)
AST (SGOT): 33 U/L (ref 5–34)
Albumin/Globulin Ratio: 1.4 (ref 0.9–2.2)
Albumin: 4 g/dL (ref 3.5–5.0)
Alkaline Phosphatase: 100 U/L (ref 50–130)
Anion Gap: 10 (ref 5.0–15.0)
BUN: 9 mg/dL (ref 7.0–19.0)
Bilirubin, Total: 0.9 mg/dL (ref 0.2–1.2)
CO2: 25 mEq/L (ref 21–29)
Calcium: 9.4 mg/dL (ref 8.5–10.5)
Chloride: 105 mEq/L (ref 100–111)
Creatinine: 0.9 mg/dL (ref 0.4–1.5)
Globulin: 2.8 g/dL (ref 2.0–3.7)
Glucose: 134 mg/dL — ABNORMAL HIGH (ref 70–100)
Potassium: 4.9 mEq/L (ref 3.5–5.1)
Protein, Total: 6.8 g/dL (ref 6.0–8.3)
Sodium: 140 mEq/L (ref 136–145)

## 2019-09-19 LAB — CBC AND DIFFERENTIAL
Absolute NRBC: 0 10*3/uL (ref 0.00–0.00)
Basophils Absolute Automated: 0.01 10*3/uL (ref 0.00–0.08)
Basophils Automated: 0.3 %
Eosinophils Absolute Automated: 0.1 10*3/uL (ref 0.00–0.44)
Eosinophils Automated: 3.3 %
Hematocrit: 37.1 % (ref 34.7–43.7)
Hgb: 12.7 g/dL (ref 11.4–14.8)
Immature Granulocytes Absolute: 0.01 10*3/uL (ref 0.00–0.07)
Immature Granulocytes: 0.3 %
Lymphocytes Absolute Automated: 1.18 10*3/uL (ref 0.42–3.22)
Lymphocytes Automated: 39.2 %
MCH: 29.8 pg (ref 25.1–33.5)
MCHC: 34.2 g/dL (ref 31.5–35.8)
MCV: 87.1 fL (ref 78.0–96.0)
MPV: 9.6 fL (ref 8.9–12.5)
Monocytes Absolute Automated: 0.22 10*3/uL (ref 0.21–0.85)
Monocytes: 7.3 %
Neutrophils Absolute: 1.49 10*3/uL (ref 1.10–6.33)
Neutrophils: 49.6 %
Nucleated RBC: 0 /100 WBC (ref 0.0–0.0)
Platelets: 62 10*3/uL — ABNORMAL LOW (ref 142–346)
RBC: 4.26 10*6/uL (ref 3.90–5.10)
RDW: 13 % (ref 11–15)
WBC: 3.01 10*3/uL — ABNORMAL LOW (ref 3.10–9.50)

## 2019-09-19 LAB — EBV DNA, QUANTITATIVE PCR
Epstein-Barr Virus DNA Quan PCR Log Copies/ML: 3.8 — ABNORMAL HIGH (ref <2.30)
Epstein-Barr virus, DNA, QUAN PCR: 6333 — ABNORMAL HIGH (ref <200)

## 2019-09-19 LAB — BILIRUBIN, DIRECT: Bilirubin Direct: 0.4 mg/dL (ref 0.0–0.5)

## 2019-09-19 LAB — CYTOMEGALOVIRUS DNA, QUANTITATIVE REAL-TIME PCR
CMV DNA, QN PCR: <2.3 (ref <2.30)
CMV DNA,QN Real Time PCR: <200 (ref <200)

## 2019-09-19 LAB — TACROLIMUS LEVEL: FK506: 6.9 ng/mL (ref 5.0–20.0)

## 2019-09-19 LAB — PHOSPHORUS: Phosphorus: 4.7 mg/dL (ref 2.3–4.7)

## 2019-09-19 LAB — MAGNESIUM: Magnesium: 1.7 mg/dL (ref 1.6–2.6)

## 2019-09-19 LAB — HEMOLYSIS INDEX: Hemolysis Index: 6 (ref 0–18)

## 2019-09-19 LAB — GGT: GGT: 44 U/L — ABNORMAL HIGH (ref 9–36)

## 2019-09-22 DIAGNOSIS — D849 Immunodeficiency, unspecified: Secondary | ICD-10-CM | POA: Insufficient documentation

## 2019-09-23 ENCOUNTER — Other Ambulatory Visit (FREE_STANDING_LABORATORY_FACILITY): Payer: No Typology Code available for payment source

## 2019-09-23 DIAGNOSIS — Z944 Liver transplant status: Secondary | ICD-10-CM

## 2019-09-23 LAB — CYTOMEGALOVIRUS DNA, QUANTITATIVE REAL-TIME PCR
CMV DNA, QN PCR: <2.3 (ref <2.30)
CMV DNA,QN Real Time PCR: <200 (ref <200)

## 2019-09-23 LAB — PHOSPHORUS: Phosphorus: 4.8 mg/dL — ABNORMAL HIGH (ref 2.3–4.7)

## 2019-09-23 LAB — HEMOLYSIS INDEX: Hemolysis Index: 6 (ref 0–18)

## 2019-09-23 LAB — COMPREHENSIVE METABOLIC PANEL
ALT: 41 U/L (ref 0–55)
AST (SGOT): 30 U/L (ref 5–34)
Albumin/Globulin Ratio: 1.5 (ref 0.9–2.2)
Albumin: 4.3 g/dL (ref 3.5–5.0)
Alkaline Phosphatase: 109 U/L (ref 50–130)
Anion Gap: 10 (ref 5.0–15.0)
BUN: 13 mg/dL (ref 7.0–19.0)
Bilirubin, Total: 0.9 mg/dL (ref 0.2–1.2)
CO2: 24 mEq/L (ref 21–29)
Calcium: 9.4 mg/dL (ref 8.5–10.5)
Chloride: 103 mEq/L (ref 100–111)
Creatinine: 0.9 mg/dL (ref 0.4–1.5)
Globulin: 2.9 g/dL (ref 2.0–3.7)
Glucose: 151 mg/dL — ABNORMAL HIGH (ref 70–100)
Potassium: 5.3 mEq/L — ABNORMAL HIGH (ref 3.5–5.1)
Protein, Total: 7.2 g/dL (ref 6.0–8.3)
Sodium: 137 mEq/L (ref 136–145)

## 2019-09-23 LAB — CBC AND DIFFERENTIAL
Absolute NRBC: 0 10*3/uL (ref 0.00–0.00)
Basophils Absolute Automated: 0.02 10*3/uL (ref 0.00–0.08)
Basophils Automated: 0.5 %
Eosinophils Absolute Automated: 0.11 10*3/uL (ref 0.00–0.44)
Eosinophils Automated: 2.7 %
Hematocrit: 37.2 % (ref 34.7–43.7)
Hgb: 12.9 g/dL (ref 11.4–14.8)
Immature Granulocytes Absolute: 0.01 10*3/uL (ref 0.00–0.07)
Immature Granulocytes: 0.2 %
Lymphocytes Absolute Automated: 1.44 10*3/uL (ref 0.42–3.22)
Lymphocytes Automated: 35.5 %
MCH: 29.9 pg (ref 25.1–33.5)
MCHC: 34.7 g/dL (ref 31.5–35.8)
MCV: 86.3 fL (ref 78.0–96.0)
MPV: 9.4 fL (ref 8.9–12.5)
Monocytes Absolute Automated: 0.3 10*3/uL (ref 0.21–0.85)
Monocytes: 7.4 %
Neutrophils Absolute: 2.18 10*3/uL (ref 1.10–6.33)
Neutrophils: 53.7 %
Nucleated RBC: 0 /100 WBC (ref 0.0–0.0)
Platelets: 63 10*3/uL — ABNORMAL LOW (ref 142–346)
RBC: 4.31 10*6/uL (ref 3.90–5.10)
RDW: 13 % (ref 11–15)
WBC: 4.06 10*3/uL (ref 3.10–9.50)

## 2019-09-23 LAB — GGT: GGT: 43 U/L — ABNORMAL HIGH (ref 9–36)

## 2019-09-23 LAB — TACROLIMUS LEVEL: FK506: 6.6 ng/mL (ref 5.0–20.0)

## 2019-09-23 LAB — BILIRUBIN, DIRECT: Bilirubin Direct: 0.4 mg/dL (ref 0.0–0.5)

## 2019-09-23 LAB — MAGNESIUM: Magnesium: 1.8 mg/dL (ref 1.6–2.6)

## 2019-09-25 ENCOUNTER — Other Ambulatory Visit (INDEPENDENT_AMBULATORY_CARE_PROVIDER_SITE_OTHER): Payer: Self-pay | Admitting: Pediatric Pulmonology

## 2019-09-25 LAB — EBV DNA, QUANTITATIVE PCR
Epstein-Barr Virus DNA Quan PCR Log Copies/ML: 2.72 — ABNORMAL HIGH (ref <2.30)
Epstein-Barr virus, DNA, QUAN PCR: 526 — ABNORMAL HIGH (ref <200)

## 2019-09-25 MED ORDER — SULFAMETHOXAZOLE-TRIMETHOPRIM 800-160 MG PO TABS
1.00 | ORAL_TABLET | Freq: Two times a day (BID) | ORAL | 0 refills | Status: AC
Start: 2019-09-25 — End: 2019-10-05

## 2019-09-25 MED ORDER — LEVOFLOXACIN 500 MG PO TABS
500.00 mg | ORAL_TABLET | Freq: Every day | ORAL | 0 refills | Status: AC
Start: 2019-09-25 — End: 2019-10-05

## 2019-10-01 ENCOUNTER — Other Ambulatory Visit (FREE_STANDING_LABORATORY_FACILITY): Payer: No Typology Code available for payment source

## 2019-10-01 DIAGNOSIS — Z944 Liver transplant status: Secondary | ICD-10-CM

## 2019-10-01 LAB — CBC AND DIFFERENTIAL
Absolute NRBC: 0 10*3/uL (ref 0.00–0.00)
Basophils Absolute Automated: 0.02 10*3/uL (ref 0.00–0.08)
Basophils Automated: 0.6 %
Eosinophils Absolute Automated: 0.1 10*3/uL (ref 0.00–0.44)
Eosinophils Automated: 3.1 %
Hematocrit: 37.3 % (ref 34.7–43.7)
Hgb: 12.7 g/dL (ref 11.4–14.8)
Immature Granulocytes Absolute: 0.01 10*3/uL (ref 0.00–0.07)
Immature Granulocytes: 0.3 %
Lymphocytes Absolute Automated: 1.03 10*3/uL (ref 0.42–3.22)
Lymphocytes Automated: 32 %
MCH: 29.3 pg (ref 25.1–33.5)
MCHC: 34 g/dL (ref 31.5–35.8)
MCV: 85.9 fL (ref 78.0–96.0)
MPV: 10.2 fL (ref 8.9–12.5)
Monocytes Absolute Automated: 0.2 10*3/uL — ABNORMAL LOW (ref 0.21–0.85)
Monocytes: 6.2 %
Neutrophils Absolute: 1.86 10*3/uL (ref 1.10–6.33)
Neutrophils: 57.8 %
Nucleated RBC: 0 /100 WBC (ref 0.0–0.0)
Platelets: 69 10*3/uL — ABNORMAL LOW (ref 142–346)
RBC: 4.34 10*6/uL (ref 3.90–5.10)
RDW: 12 % (ref 11–15)
WBC: 3.22 10*3/uL (ref 3.10–9.50)

## 2019-10-01 LAB — COMPREHENSIVE METABOLIC PANEL
ALT: 52 U/L (ref 0–55)
AST (SGOT): 36 U/L — ABNORMAL HIGH (ref 5–34)
Albumin/Globulin Ratio: 1.4 (ref 0.9–2.2)
Albumin: 4.2 g/dL (ref 3.5–5.0)
Alkaline Phosphatase: 107 U/L (ref 50–130)
Anion Gap: 9 (ref 5.0–15.0)
BUN: 10 mg/dL (ref 7.0–19.0)
Bilirubin, Total: 0.7 mg/dL (ref 0.2–1.2)
CO2: 24 mEq/L (ref 21–29)
Calcium: 9.4 mg/dL (ref 8.5–10.5)
Chloride: 105 mEq/L (ref 100–111)
Creatinine: 0.9 mg/dL (ref 0.4–1.5)
Globulin: 3 g/dL (ref 2.0–3.7)
Glucose: 135 mg/dL — ABNORMAL HIGH (ref 70–100)
Potassium: 5.2 mEq/L — ABNORMAL HIGH (ref 3.5–5.1)
Protein, Total: 7.2 g/dL (ref 6.0–8.3)
Sodium: 138 mEq/L (ref 136–145)

## 2019-10-01 LAB — PHOSPHORUS: Phosphorus: 4.5 mg/dL (ref 2.3–4.7)

## 2019-10-01 LAB — TACROLIMUS LEVEL: FK506: 6.5 ng/mL (ref 5.0–20.0)

## 2019-10-01 LAB — MAGNESIUM: Magnesium: 1.8 mg/dL (ref 1.6–2.6)

## 2019-10-01 LAB — HEMOLYSIS INDEX: Hemolysis Index: 8 (ref 0–18)

## 2019-10-01 LAB — BILIRUBIN, DIRECT: Bilirubin Direct: 0.3 mg/dL (ref 0.0–0.5)

## 2019-10-01 LAB — GGT: GGT: 39 U/L — ABNORMAL HIGH (ref 9–36)

## 2019-10-02 LAB — CYTOMEGALOVIRUS DNA, QUANTITATIVE REAL-TIME PCR
CMV DNA, QN PCR: <2.3 (ref <2.30)
CMV DNA,QN Real Time PCR: <200 (ref <200)

## 2019-10-02 LAB — EBV DNA, QUANTITATIVE PCR
Epstein-Barr Virus DNA Quan PCR Log Copies/ML: 2.54 — ABNORMAL HIGH (ref <2.30)
Epstein-Barr virus, DNA, QUAN PCR: 347 — ABNORMAL HIGH (ref <200)

## 2019-10-07 ENCOUNTER — Other Ambulatory Visit (FREE_STANDING_LABORATORY_FACILITY): Payer: No Typology Code available for payment source

## 2019-10-07 DIAGNOSIS — Z944 Liver transplant status: Secondary | ICD-10-CM

## 2019-10-07 LAB — COMPREHENSIVE METABOLIC PANEL
ALT: 55 U/L (ref 0–55)
AST (SGOT): 44 U/L — ABNORMAL HIGH (ref 5–34)
Albumin/Globulin Ratio: 1.6 (ref 0.9–2.2)
Albumin: 4.5 g/dL (ref 3.5–5.0)
Alkaline Phosphatase: 106 U/L (ref 50–130)
Anion Gap: 9 (ref 5.0–15.0)
BUN: 12 mg/dL (ref 7.0–19.0)
Bilirubin, Total: 1 mg/dL (ref 0.2–1.2)
CO2: 25 mEq/L (ref 21–29)
Calcium: 9.3 mg/dL (ref 8.5–10.5)
Chloride: 106 mEq/L (ref 100–111)
Creatinine: 1 mg/dL (ref 0.4–1.5)
Globulin: 2.9 g/dL (ref 2.0–3.7)
Glucose: 120 mg/dL — ABNORMAL HIGH (ref 70–100)
Potassium: 5.3 mEq/L — ABNORMAL HIGH (ref 3.5–5.1)
Protein, Total: 7.4 g/dL (ref 6.0–8.3)
Sodium: 140 mEq/L (ref 136–145)

## 2019-10-07 LAB — GGT: GGT: 36 U/L (ref 9–36)

## 2019-10-07 LAB — CBC AND DIFFERENTIAL
Absolute NRBC: 0 10*3/uL (ref 0.00–0.00)
Basophils Absolute Automated: 0.01 10*3/uL (ref 0.00–0.08)
Basophils Automated: 0.3 %
Eosinophils Absolute Automated: 0.07 10*3/uL (ref 0.00–0.44)
Eosinophils Automated: 2.4 %
Hematocrit: 37.5 % (ref 34.7–43.7)
Hgb: 12.7 g/dL (ref 11.4–14.8)
Immature Granulocytes Absolute: 0.01 10*3/uL (ref 0.00–0.07)
Immature Granulocytes: 0.3 %
Lymphocytes Absolute Automated: 1.11 10*3/uL (ref 0.42–3.22)
Lymphocytes Automated: 37.6 %
MCH: 29 pg (ref 25.1–33.5)
MCHC: 33.9 g/dL (ref 31.5–35.8)
MCV: 85.6 fL (ref 78.0–96.0)
MPV: 9.9 fL (ref 8.9–12.5)
Monocytes Absolute Automated: 0.14 10*3/uL — ABNORMAL LOW (ref 0.21–0.85)
Monocytes: 4.7 %
Neutrophils Absolute: 1.61 10*3/uL (ref 1.10–6.33)
Neutrophils: 54.7 %
Nucleated RBC: 0 /100 WBC (ref 0.0–0.0)
Platelets: 61 10*3/uL — ABNORMAL LOW (ref 142–346)
RBC: 4.38 10*6/uL (ref 3.90–5.10)
RDW: 13 % (ref 11–15)
WBC: 2.95 10*3/uL — ABNORMAL LOW (ref 3.10–9.50)

## 2019-10-07 LAB — TACROLIMUS LEVEL: FK506: 9.7 ng/mL (ref 5.0–20.0)

## 2019-10-07 LAB — HEMOLYSIS INDEX: Hemolysis Index: 5 (ref 0–18)

## 2019-10-07 LAB — BILIRUBIN, DIRECT: Bilirubin Direct: 0.4 mg/dL (ref 0.0–0.5)

## 2019-10-07 LAB — MAGNESIUM: Magnesium: 1.9 mg/dL (ref 1.6–2.6)

## 2019-10-07 LAB — PHOSPHORUS: Phosphorus: 5.2 mg/dL — ABNORMAL HIGH (ref 2.3–4.7)

## 2019-10-08 LAB — SARS COV 2 ANTIBODY IGG: SARS CoV 2 Antibody IgG: POSITIVE — AB

## 2019-10-08 LAB — PATHOLOGY REVIEW - COVID ANTIBODIES

## 2019-10-09 LAB — EBV DNA, QUANTITATIVE PCR
Epstein-Barr Virus DNA Quan PCR Log Copies/ML: 3.26 — ABNORMAL HIGH (ref <2.30)
Epstein-Barr virus, DNA, QUAN PCR: 1825 — ABNORMAL HIGH (ref <200)

## 2019-10-09 LAB — CYTOMEGALOVIRUS DNA, QUANTITATIVE REAL-TIME PCR
CMV DNA, QN PCR: <2.3 (ref <2.30)
CMV DNA,QN Real Time PCR: <200 (ref <200)

## 2019-10-10 ENCOUNTER — Other Ambulatory Visit (FREE_STANDING_LABORATORY_FACILITY): Payer: No Typology Code available for payment source

## 2019-10-10 DIAGNOSIS — Z944 Liver transplant status: Secondary | ICD-10-CM

## 2019-10-10 LAB — COMPREHENSIVE METABOLIC PANEL
ALT: 37 U/L (ref 0–55)
AST (SGOT): 33 U/L (ref 5–34)
Albumin/Globulin Ratio: 1.6 (ref 0.9–2.2)
Albumin: 4.4 g/dL (ref 3.5–5.0)
Alkaline Phosphatase: 98 U/L (ref 50–130)
Anion Gap: 11 (ref 5.0–15.0)
BUN: 15 mg/dL (ref 7.0–19.0)
Bilirubin, Total: 1 mg/dL (ref 0.2–1.2)
CO2: 24 mEq/L (ref 21–29)
Calcium: 9.3 mg/dL (ref 8.5–10.5)
Chloride: 106 mEq/L (ref 100–111)
Creatinine: 1 mg/dL (ref 0.4–1.5)
Globulin: 2.8 g/dL (ref 2.0–3.7)
Glucose: 121 mg/dL — ABNORMAL HIGH (ref 70–100)
Potassium: 5 mEq/L (ref 3.5–5.1)
Protein, Total: 7.2 g/dL (ref 6.0–8.3)
Sodium: 141 mEq/L (ref 136–145)

## 2019-10-10 LAB — PHOSPHORUS: Phosphorus: 4.4 mg/dL (ref 2.3–4.7)

## 2019-10-10 LAB — CBC AND DIFFERENTIAL
Absolute NRBC: 0 10*3/uL (ref 0.00–0.00)
Basophils Absolute Automated: 0.01 10*3/uL (ref 0.00–0.08)
Basophils Automated: 0.4 %
Eosinophils Absolute Automated: 0.08 10*3/uL (ref 0.00–0.44)
Eosinophils Automated: 2.9 %
Hematocrit: 32.7 % — ABNORMAL LOW (ref 34.7–43.7)
Hgb: 11.3 g/dL — ABNORMAL LOW (ref 11.4–14.8)
Immature Granulocytes Absolute: 0.01 10*3/uL (ref 0.00–0.07)
Immature Granulocytes: 0.4 %
Lymphocytes Absolute Automated: 0.99 10*3/uL (ref 0.42–3.22)
Lymphocytes Automated: 36 %
MCH: 29.4 pg (ref 25.1–33.5)
MCHC: 34.6 g/dL (ref 31.5–35.8)
MCV: 84.9 fL (ref 78.0–96.0)
MPV: 10.3 fL (ref 8.9–12.5)
Monocytes Absolute Automated: 0.2 10*3/uL — ABNORMAL LOW (ref 0.21–0.85)
Monocytes: 7.3 %
Neutrophils Absolute: 1.46 10*3/uL (ref 1.10–6.33)
Neutrophils: 53 %
Nucleated RBC: 0 /100 WBC (ref 0.0–0.0)
Platelets: 61 10*3/uL — ABNORMAL LOW (ref 142–346)
RBC: 3.85 10*6/uL — ABNORMAL LOW (ref 3.90–5.10)
RDW: 13 % (ref 11–15)
WBC: 2.75 10*3/uL — ABNORMAL LOW (ref 3.10–9.50)

## 2019-10-10 LAB — HEMOLYSIS INDEX: Hemolysis Index: 6 (ref 0–18)

## 2019-10-10 LAB — GGT: GGT: 29 U/L (ref 9–36)

## 2019-10-10 LAB — TACROLIMUS LEVEL: FK506: 6.9 ng/mL (ref 5.0–20.0)

## 2019-10-10 LAB — BILIRUBIN, DIRECT: Bilirubin Direct: 0.4 mg/dL (ref 0.0–0.5)

## 2019-10-10 LAB — MAGNESIUM: Magnesium: 1.9 mg/dL (ref 1.6–2.6)

## 2019-10-14 ENCOUNTER — Other Ambulatory Visit (FREE_STANDING_LABORATORY_FACILITY): Payer: No Typology Code available for payment source

## 2019-10-14 DIAGNOSIS — Z944 Liver transplant status: Secondary | ICD-10-CM

## 2019-10-14 LAB — COMPREHENSIVE METABOLIC PANEL
ALT: 52 U/L (ref 0–55)
AST (SGOT): 45 U/L — ABNORMAL HIGH (ref 5–34)
Albumin/Globulin Ratio: 1.4 (ref 0.9–2.2)
Albumin: 4.3 g/dL (ref 3.5–5.0)
Alkaline Phosphatase: 109 U/L (ref 50–130)
Anion Gap: 10 (ref 5.0–15.0)
BUN: 12 mg/dL (ref 7.0–19.0)
Bilirubin, Total: 1 mg/dL (ref 0.2–1.2)
CO2: 25 mEq/L (ref 21–29)
Calcium: 9.3 mg/dL (ref 8.5–10.5)
Chloride: 104 mEq/L (ref 100–111)
Creatinine: 1.1 mg/dL (ref 0.4–1.5)
Globulin: 3 g/dL (ref 2.0–3.7)
Glucose: 125 mg/dL — ABNORMAL HIGH (ref 70–100)
Potassium: 4.9 mEq/L (ref 3.5–5.1)
Protein, Total: 7.3 g/dL (ref 6.0–8.3)
Sodium: 139 mEq/L (ref 136–145)

## 2019-10-14 LAB — CBC AND DIFFERENTIAL
Absolute NRBC: 0 10*3/uL (ref 0.00–0.00)
Basophils Absolute Automated: 0.01 10*3/uL (ref 0.00–0.08)
Basophils Automated: 0.4 %
Eosinophils Absolute Automated: 0.08 10*3/uL (ref 0.00–0.44)
Eosinophils Automated: 2.9 %
Hematocrit: 34.6 % — ABNORMAL LOW (ref 34.7–43.7)
Hgb: 12 g/dL (ref 11.4–14.8)
Immature Granulocytes Absolute: 0 10*3/uL (ref 0.00–0.07)
Immature Granulocytes: 0 %
Lymphocytes Absolute Automated: 1.06 10*3/uL (ref 0.42–3.22)
Lymphocytes Automated: 38.8 %
MCH: 29.9 pg (ref 25.1–33.5)
MCHC: 34.7 g/dL (ref 31.5–35.8)
MCV: 86.3 fL (ref 78.0–96.0)
MPV: 10 fL (ref 8.9–12.5)
Monocytes Absolute Automated: 0.2 10*3/uL — ABNORMAL LOW (ref 0.21–0.85)
Monocytes: 7.3 %
Neutrophils Absolute: 1.38 10*3/uL (ref 1.10–6.33)
Neutrophils: 50.6 %
Nucleated RBC: 0 /100 WBC (ref 0.0–0.0)
Platelets: 62 10*3/uL — ABNORMAL LOW (ref 142–346)
RBC: 4.01 10*6/uL (ref 3.90–5.10)
RDW: 14 % (ref 11–15)
WBC: 2.73 10*3/uL — ABNORMAL LOW (ref 3.10–9.50)

## 2019-10-14 LAB — BILIRUBIN, DIRECT: Bilirubin Direct: 0.3 mg/dL (ref 0.0–0.5)

## 2019-10-14 LAB — PHOSPHORUS: Phosphorus: 4.8 mg/dL — ABNORMAL HIGH (ref 2.3–4.7)

## 2019-10-14 LAB — GGT: GGT: 32 U/L (ref 9–36)

## 2019-10-14 LAB — MAGNESIUM: Magnesium: 1.7 mg/dL (ref 1.6–2.6)

## 2019-10-14 LAB — HEMOLYSIS INDEX: Hemolysis Index: 2 (ref 0–18)

## 2019-10-14 LAB — TACROLIMUS LEVEL: FK506: 7.8 ng/mL (ref 5.0–20.0)

## 2019-10-15 LAB — CYTOMEGALOVIRUS DNA, QUANTITATIVE REAL-TIME PCR
CMV DNA, QN PCR: <2.3 (ref <2.30)
CMV DNA,QN Real Time PCR: <200 (ref <200)

## 2019-10-16 LAB — EBV DNA, QUANTITATIVE PCR
Epstein-Barr Virus DNA Quan PCR Log Copies/ML: 3.29 — ABNORMAL HIGH (ref <2.30)
Epstein-Barr virus, DNA, QUAN PCR: 1956 — ABNORMAL HIGH (ref <200)

## 2019-10-17 ENCOUNTER — Other Ambulatory Visit (FREE_STANDING_LABORATORY_FACILITY): Payer: No Typology Code available for payment source

## 2019-10-17 DIAGNOSIS — Z940 Kidney transplant status: Secondary | ICD-10-CM

## 2019-10-17 LAB — CBC AND DIFFERENTIAL
Absolute NRBC: 0 10*3/uL (ref 0.00–0.00)
Basophils Absolute Automated: 0.02 10*3/uL (ref 0.00–0.08)
Basophils Automated: 0.8 %
Eosinophils Absolute Automated: 0.09 10*3/uL (ref 0.00–0.44)
Eosinophils Automated: 3.6 %
Hematocrit: 33.4 % — ABNORMAL LOW (ref 34.7–43.7)
Hgb: 11.8 g/dL (ref 11.4–14.8)
Immature Granulocytes Absolute: 0 10*3/uL (ref 0.00–0.07)
Immature Granulocytes: 0 %
Lymphocytes Absolute Automated: 1.03 10*3/uL (ref 0.42–3.22)
Lymphocytes Automated: 41.5 %
MCH: 29.9 pg (ref 25.1–33.5)
MCHC: 35.3 g/dL (ref 31.5–35.8)
MCV: 84.8 fL (ref 78.0–96.0)
MPV: 10.4 fL (ref 8.9–12.5)
Monocytes Absolute Automated: 0.15 10*3/uL — ABNORMAL LOW (ref 0.21–0.85)
Monocytes: 6 %
Neutrophils Absolute: 1.19 10*3/uL (ref 1.10–6.33)
Neutrophils: 48.1 %
Nucleated RBC: 0 /100 WBC (ref 0.0–0.0)
Platelets: 60 10*3/uL — ABNORMAL LOW (ref 142–346)
RBC: 3.94 10*6/uL (ref 3.90–5.10)
RDW: 14 % (ref 11–15)
WBC: 2.48 10*3/uL — ABNORMAL LOW (ref 3.10–9.50)

## 2019-10-17 LAB — COMPREHENSIVE METABOLIC PANEL
ALT: 45 U/L (ref 0–55)
AST (SGOT): 39 U/L — ABNORMAL HIGH (ref 5–34)
Albumin/Globulin Ratio: 1.4 (ref 0.9–2.2)
Albumin: 4.1 g/dL (ref 3.5–5.0)
Alkaline Phosphatase: 107 U/L (ref 50–130)
Anion Gap: 10 (ref 5.0–15.0)
BUN: 8 mg/dL (ref 7.0–19.0)
Bilirubin, Total: 0.8 mg/dL (ref 0.2–1.2)
CO2: 25 mEq/L (ref 21–29)
Calcium: 9.5 mg/dL (ref 8.5–10.5)
Chloride: 103 mEq/L (ref 100–111)
Creatinine: 0.9 mg/dL (ref 0.4–1.5)
Globulin: 2.9 g/dL (ref 2.0–3.7)
Glucose: 109 mg/dL — ABNORMAL HIGH (ref 70–100)
Potassium: 4.8 mEq/L (ref 3.5–5.1)
Protein, Total: 7 g/dL (ref 6.0–8.3)
Sodium: 138 mEq/L (ref 136–145)

## 2019-10-17 LAB — BILIRUBIN, DIRECT: Bilirubin Direct: 0.3 mg/dL (ref 0.0–0.5)

## 2019-10-17 LAB — HEMOLYSIS INDEX: Hemolysis Index: 6 (ref 0–18)

## 2019-10-17 LAB — GGT: GGT: 24 U/L (ref 9–36)

## 2019-10-17 LAB — PHOSPHORUS: Phosphorus: 4.3 mg/dL (ref 2.3–4.7)

## 2019-10-17 LAB — TACROLIMUS LEVEL: FK506: 7.1 ng/mL (ref 5.0–20.0)

## 2019-10-17 LAB — MAGNESIUM: Magnesium: 1.5 mg/dL — ABNORMAL LOW (ref 1.6–2.6)

## 2019-10-21 ENCOUNTER — Other Ambulatory Visit (FREE_STANDING_LABORATORY_FACILITY): Payer: No Typology Code available for payment source

## 2019-10-21 DIAGNOSIS — Z944 Liver transplant status: Secondary | ICD-10-CM

## 2019-10-21 LAB — CBC AND DIFFERENTIAL
Absolute NRBC: 0 10*3/uL (ref 0.00–0.00)
Basophils Absolute Automated: 0.01 10*3/uL (ref 0.00–0.08)
Basophils Automated: 0.4 %
Eosinophils Absolute Automated: 0.09 10*3/uL (ref 0.00–0.44)
Eosinophils Automated: 3.7 %
Hematocrit: 36.8 % (ref 34.7–43.7)
Hgb: 12.9 g/dL (ref 11.4–14.8)
Immature Granulocytes Absolute: 0.02 10*3/uL (ref 0.00–0.07)
Immature Granulocytes: 0.8 %
Lymphocytes Absolute Automated: 0.89 10*3/uL (ref 0.42–3.22)
Lymphocytes Automated: 36.2 %
MCH: 29.7 pg (ref 25.1–33.5)
MCHC: 35.1 g/dL (ref 31.5–35.8)
MCV: 84.8 fL (ref 78.0–96.0)
MPV: 10.3 fL (ref 8.9–12.5)
Monocytes Absolute Automated: 0.13 10*3/uL — ABNORMAL LOW (ref 0.21–0.85)
Monocytes: 5.3 %
Neutrophils Absolute: 1.32 10*3/uL (ref 1.10–6.33)
Neutrophils: 53.6 %
Nucleated RBC: 0 /100 WBC (ref 0.0–0.0)
Platelets: 54 10*3/uL — ABNORMAL LOW (ref 142–346)
RBC: 4.34 10*6/uL (ref 3.90–5.10)
RDW: 15 % (ref 11–15)
WBC: 2.46 10*3/uL — ABNORMAL LOW (ref 3.10–9.50)

## 2019-10-21 LAB — COMPREHENSIVE METABOLIC PANEL
ALT: 44 U/L (ref 0–55)
AST (SGOT): 35 U/L — ABNORMAL HIGH (ref 5–34)
Albumin/Globulin Ratio: 1.6 (ref 0.9–2.2)
Albumin: 4.2 g/dL (ref 3.5–5.0)
Alkaline Phosphatase: 119 U/L (ref 50–130)
Anion Gap: 8 (ref 5.0–15.0)
BUN: 10 mg/dL (ref 7.0–19.0)
Bilirubin, Total: 0.8 mg/dL (ref 0.2–1.2)
CO2: 24 mEq/L (ref 21–29)
Calcium: 9.1 mg/dL (ref 8.5–10.5)
Chloride: 106 mEq/L (ref 100–111)
Creatinine: 0.9 mg/dL (ref 0.4–1.5)
Globulin: 2.6 g/dL (ref 2.0–3.7)
Glucose: 124 mg/dL — ABNORMAL HIGH (ref 70–100)
Potassium: 5.5 mEq/L — ABNORMAL HIGH (ref 3.5–5.1)
Protein, Total: 6.8 g/dL (ref 6.0–8.3)
Sodium: 138 mEq/L (ref 136–145)

## 2019-10-21 LAB — HEMOLYSIS INDEX: Hemolysis Index: 8 (ref 0–18)

## 2019-10-21 LAB — GGT: GGT: 24 U/L (ref 9–36)

## 2019-10-21 LAB — BILIRUBIN, DIRECT: Bilirubin Direct: 0.3 mg/dL (ref 0.0–0.5)

## 2019-10-21 LAB — MAGNESIUM: Magnesium: 1.6 mg/dL (ref 1.6–2.6)

## 2019-10-21 LAB — TACROLIMUS LEVEL: FK506: 8.9 ng/mL (ref 5.0–20.0)

## 2019-10-21 LAB — PHOSPHORUS: Phosphorus: 4.2 mg/dL (ref 2.3–4.7)

## 2019-10-22 LAB — CYTOMEGALOVIRUS DNA, QUANTITATIVE REAL-TIME PCR
CMV DNA, QN PCR: <2.3 (ref <2.30)
CMV DNA,QN Real Time PCR: <200 (ref <200)

## 2019-10-23 LAB — EBV DNA, QUANTITATIVE PCR
Epstein-Barr Virus DNA Quan PCR Log Copies/ML: 3.71 — ABNORMAL HIGH (ref <2.30)
Epstein-Barr virus, DNA, QUAN PCR: 5147 — ABNORMAL HIGH (ref <200)

## 2019-10-24 ENCOUNTER — Other Ambulatory Visit (FREE_STANDING_LABORATORY_FACILITY): Payer: No Typology Code available for payment source

## 2019-10-24 DIAGNOSIS — Z944 Liver transplant status: Secondary | ICD-10-CM

## 2019-10-24 LAB — GGT: GGT: 21 U/L (ref 9–36)

## 2019-10-24 LAB — COMPREHENSIVE METABOLIC PANEL
ALT: 33 U/L (ref 0–55)
AST (SGOT): 31 U/L (ref 5–34)
Albumin/Globulin Ratio: 1.5 (ref 0.9–2.2)
Albumin: 4.4 g/dL (ref 3.5–5.0)
Alkaline Phosphatase: 118 U/L (ref 50–130)
Anion Gap: 11 (ref 5.0–15.0)
BUN: 11 mg/dL (ref 7.0–19.0)
Bilirubin, Total: 0.9 mg/dL (ref 0.2–1.2)
CO2: 22 mEq/L (ref 21–29)
Calcium: 9.4 mg/dL (ref 8.5–10.5)
Chloride: 106 mEq/L (ref 100–111)
Creatinine: 1 mg/dL (ref 0.4–1.5)
Globulin: 2.9 g/dL (ref 2.0–3.7)
Glucose: 127 mg/dL — ABNORMAL HIGH (ref 70–100)
Potassium: 4.9 mEq/L (ref 3.5–5.1)
Protein, Total: 7.3 g/dL (ref 6.0–8.3)
Sodium: 139 mEq/L (ref 136–145)

## 2019-10-24 LAB — HEMOLYSIS INDEX: Hemolysis Index: 5 (ref 0–18)

## 2019-10-24 LAB — CBC AND DIFFERENTIAL
Absolute NRBC: 0 10*3/uL (ref 0.00–0.00)
Basophils Absolute Automated: 0.01 10*3/uL (ref 0.00–0.08)
Basophils Automated: 0.4 %
Eosinophils Absolute Automated: 0.1 10*3/uL (ref 0.00–0.44)
Eosinophils Automated: 3.8 %
Hematocrit: 33.8 % — ABNORMAL LOW (ref 34.7–43.7)
Hgb: 11.7 g/dL (ref 11.4–14.8)
Immature Granulocytes Absolute: 0 10*3/uL (ref 0.00–0.07)
Immature Granulocytes: 0 %
Lymphocytes Absolute Automated: 1.01 10*3/uL (ref 0.42–3.22)
Lymphocytes Automated: 38.1 %
MCH: 29.4 pg (ref 25.1–33.5)
MCHC: 34.6 g/dL (ref 31.5–35.8)
MCV: 84.9 fL (ref 78.0–96.0)
MPV: 9.9 fL (ref 8.9–12.5)
Monocytes Absolute Automated: 0.2 10*3/uL — ABNORMAL LOW (ref 0.21–0.85)
Monocytes: 7.5 %
Neutrophils Absolute: 1.33 10*3/uL (ref 1.10–6.33)
Neutrophils: 50.2 %
Nucleated RBC: 0 /100 WBC (ref 0.0–0.0)
Platelets: 54 10*3/uL — ABNORMAL LOW (ref 142–346)
RBC: 3.98 10*6/uL (ref 3.90–5.10)
RDW: 14 % (ref 11–15)
WBC: 2.65 10*3/uL — ABNORMAL LOW (ref 3.10–9.50)

## 2019-10-24 LAB — TACROLIMUS LEVEL: FK506: 8 ng/mL (ref 5.0–20.0)

## 2019-10-24 LAB — BILIRUBIN, DIRECT: Bilirubin Direct: 0.3 mg/dL (ref 0.0–0.5)

## 2019-10-24 LAB — MAGNESIUM: Magnesium: 3.1 mg/dL — ABNORMAL HIGH (ref 1.6–2.6)

## 2019-10-28 ENCOUNTER — Other Ambulatory Visit (FREE_STANDING_LABORATORY_FACILITY): Payer: No Typology Code available for payment source

## 2019-10-28 DIAGNOSIS — Z944 Liver transplant status: Secondary | ICD-10-CM

## 2019-10-28 LAB — CBC AND DIFFERENTIAL
Absolute NRBC: 0 10*3/uL (ref 0.00–0.00)
Basophils Absolute Automated: 0.01 10*3/uL (ref 0.00–0.08)
Basophils Automated: 0.4 %
Eosinophils Absolute Automated: 0.08 10*3/uL (ref 0.00–0.44)
Eosinophils Automated: 3 %
Hematocrit: 34.6 % — ABNORMAL LOW (ref 34.7–43.7)
Hgb: 12.2 g/dL (ref 11.4–14.8)
Immature Granulocytes Absolute: 0.01 10*3/uL (ref 0.00–0.07)
Immature Granulocytes: 0.4 %
Lymphocytes Absolute Automated: 1.05 10*3/uL (ref 0.42–3.22)
Lymphocytes Automated: 38.9 %
MCH: 29.5 pg (ref 25.1–33.5)
MCHC: 35.3 g/dL (ref 31.5–35.8)
MCV: 83.6 fL (ref 78.0–96.0)
MPV: 10.1 fL (ref 8.9–12.5)
Monocytes Absolute Automated: 0.18 10*3/uL — ABNORMAL LOW (ref 0.21–0.85)
Monocytes: 6.7 %
Neutrophils Absolute: 1.37 10*3/uL (ref 1.10–6.33)
Neutrophils: 50.6 %
Nucleated RBC: 0 /100 WBC (ref 0.0–0.0)
Platelets: 52 10*3/uL — ABNORMAL LOW (ref 142–346)
RBC: 4.14 10*6/uL (ref 3.90–5.10)
RDW: 14 % (ref 11–15)
WBC: 2.7 10*3/uL — ABNORMAL LOW (ref 3.10–9.50)

## 2019-10-28 LAB — COMPREHENSIVE METABOLIC PANEL
ALT: 37 U/L (ref 0–55)
AST (SGOT): 36 U/L — ABNORMAL HIGH (ref 5–34)
Albumin/Globulin Ratio: 1.4 (ref 0.9–2.2)
Albumin: 4.1 g/dL (ref 3.5–5.0)
Alkaline Phosphatase: 99 U/L (ref 50–130)
Anion Gap: 12 (ref 5.0–15.0)
BUN: 11 mg/dL (ref 7.0–19.0)
Bilirubin, Total: 0.8 mg/dL (ref 0.2–1.2)
CO2: 23 mEq/L (ref 21–29)
Calcium: 8.9 mg/dL (ref 8.5–10.5)
Chloride: 104 mEq/L (ref 100–111)
Creatinine: 1 mg/dL (ref 0.4–1.5)
Globulin: 2.9 g/dL (ref 2.0–3.7)
Glucose: 122 mg/dL — ABNORMAL HIGH (ref 70–100)
Potassium: 4.6 mEq/L (ref 3.5–5.1)
Protein, Total: 7 g/dL (ref 6.0–8.3)
Sodium: 139 mEq/L (ref 136–145)

## 2019-10-28 LAB — BILIRUBIN, DIRECT: Bilirubin Direct: 0.3 mg/dL (ref 0.0–0.5)

## 2019-10-28 LAB — PHOSPHORUS: Phosphorus: 4.6 mg/dL (ref 2.3–4.7)

## 2019-10-28 LAB — MAGNESIUM: Magnesium: 1.6 mg/dL (ref 1.6–2.6)

## 2019-10-28 LAB — HEMOLYSIS INDEX: Hemolysis Index: 4 (ref 0–18)

## 2019-10-28 LAB — GGT: GGT: 17 U/L (ref 9–36)

## 2019-10-28 LAB — TACROLIMUS LEVEL: FK506: 7.1 ng/mL (ref 5.0–20.0)

## 2019-10-29 LAB — CYTOMEGALOVIRUS DNA, QUANTITATIVE REAL-TIME PCR
CMV DNA, QN PCR: <2.3 (ref <2.30)
CMV DNA,QN Real Time PCR: <200 (ref <200)

## 2019-10-30 LAB — EBV DNA, QUANTITATIVE PCR
Epstein-Barr Virus DNA Quan PCR Log Copies/ML: 3.86 — ABNORMAL HIGH (ref <2.30)
Epstein-Barr virus, DNA, QUAN PCR: 7272 — ABNORMAL HIGH (ref <200)

## 2019-10-31 ENCOUNTER — Other Ambulatory Visit (FREE_STANDING_LABORATORY_FACILITY): Payer: No Typology Code available for payment source

## 2019-10-31 DIAGNOSIS — Z944 Liver transplant status: Secondary | ICD-10-CM

## 2019-10-31 LAB — COMPREHENSIVE METABOLIC PANEL
ALT: 39 U/L (ref 0–55)
AST (SGOT): 35 U/L — ABNORMAL HIGH (ref 5–34)
Albumin/Globulin Ratio: 1.5 (ref 0.9–2.2)
Albumin: 4.3 g/dL (ref 3.5–5.0)
Alkaline Phosphatase: 105 U/L (ref 50–130)
Anion Gap: 9 (ref 5.0–15.0)
BUN: 8 mg/dL (ref 7.0–19.0)
Bilirubin, Total: 0.7 mg/dL (ref 0.2–1.2)
CO2: 24 mEq/L (ref 21–29)
Calcium: 9.2 mg/dL (ref 8.5–10.5)
Chloride: 105 mEq/L (ref 100–111)
Creatinine: 0.9 mg/dL (ref 0.4–1.5)
Globulin: 2.8 g/dL (ref 2.0–3.7)
Glucose: 129 mg/dL — ABNORMAL HIGH (ref 70–100)
Potassium: 5 mEq/L (ref 3.5–5.1)
Protein, Total: 7.1 g/dL (ref 6.0–8.3)
Sodium: 138 mEq/L (ref 136–145)

## 2019-10-31 LAB — CBC AND DIFFERENTIAL
Absolute NRBC: 0 10*3/uL (ref 0.00–0.00)
Basophils Absolute Automated: 0.02 10*3/uL (ref 0.00–0.08)
Basophils Automated: 0.7 %
Eosinophils Absolute Automated: 0.06 10*3/uL (ref 0.00–0.44)
Eosinophils Automated: 2.2 %
Hematocrit: 35.3 % (ref 34.7–43.7)
Hgb: 12.1 g/dL (ref 11.4–14.8)
Immature Granulocytes Absolute: 0 10*3/uL (ref 0.00–0.07)
Immature Granulocytes: 0 %
Lymphocytes Absolute Automated: 1.01 10*3/uL (ref 0.42–3.22)
Lymphocytes Automated: 37.5 %
MCH: 28.9 pg (ref 25.1–33.5)
MCHC: 34.3 g/dL (ref 31.5–35.8)
MCV: 84.2 fL (ref 78.0–96.0)
MPV: 9.5 fL (ref 8.9–12.5)
Monocytes Absolute Automated: 0.19 10*3/uL — ABNORMAL LOW (ref 0.21–0.85)
Monocytes: 7.1 %
Neutrophils Absolute: 1.41 10*3/uL (ref 1.10–6.33)
Neutrophils: 52.5 %
Nucleated RBC: 0 /100 WBC (ref 0.0–0.0)
Platelets: 54 10*3/uL — ABNORMAL LOW (ref 142–346)
RBC: 4.19 10*6/uL (ref 3.90–5.10)
RDW: 14 % (ref 11–15)
WBC: 2.69 10*3/uL — ABNORMAL LOW (ref 3.10–9.50)

## 2019-10-31 LAB — HEMOLYSIS INDEX: Hemolysis Index: 3 (ref 0–18)

## 2019-10-31 LAB — MAGNESIUM: Magnesium: 1.5 mg/dL — ABNORMAL LOW (ref 1.6–2.6)

## 2019-10-31 LAB — BILIRUBIN, DIRECT: Bilirubin Direct: 0.3 mg/dL (ref 0.0–0.5)

## 2019-10-31 LAB — PHOSPHORUS: Phosphorus: 4.1 mg/dL (ref 2.3–4.7)

## 2019-10-31 LAB — GGT: GGT: 17 U/L (ref 9–36)

## 2019-10-31 LAB — TACROLIMUS LEVEL: FK506: 6.4 ng/mL (ref 5.0–20.0)

## 2019-11-04 ENCOUNTER — Other Ambulatory Visit (FREE_STANDING_LABORATORY_FACILITY): Payer: No Typology Code available for payment source

## 2019-11-04 DIAGNOSIS — Z944 Liver transplant status: Secondary | ICD-10-CM

## 2019-11-04 LAB — COMPREHENSIVE METABOLIC PANEL
ALT: 40 U/L (ref 0–55)
AST (SGOT): 35 U/L — ABNORMAL HIGH (ref 5–34)
Albumin/Globulin Ratio: 1.6 (ref 0.9–2.2)
Albumin: 4.5 g/dL (ref 3.5–5.0)
Alkaline Phosphatase: 107 U/L (ref 50–130)
Anion Gap: 12 (ref 5.0–15.0)
BUN: 9 mg/dL (ref 7.0–19.0)
Bilirubin, Total: 0.7 mg/dL (ref 0.2–1.2)
CO2: 23 mEq/L (ref 21–29)
Calcium: 9.3 mg/dL (ref 8.5–10.5)
Chloride: 103 mEq/L (ref 100–111)
Creatinine: 1 mg/dL (ref 0.4–1.5)
Globulin: 2.9 g/dL (ref 2.0–3.7)
Glucose: 167 mg/dL — ABNORMAL HIGH (ref 70–100)
Potassium: 4.3 mEq/L (ref 3.5–5.1)
Protein, Total: 7.4 g/dL (ref 6.0–8.3)
Sodium: 138 mEq/L (ref 136–145)

## 2019-11-04 LAB — CBC AND DIFFERENTIAL
Absolute NRBC: 0 10*3/uL (ref 0.00–0.00)
Basophils Absolute Automated: 0.01 10*3/uL (ref 0.00–0.08)
Basophils Automated: 0.4 %
Eosinophils Absolute Automated: 0.05 10*3/uL (ref 0.00–0.44)
Eosinophils Automated: 2.2 %
Hematocrit: 38.1 % (ref 34.7–43.7)
Hgb: 13.1 g/dL (ref 11.4–14.8)
Immature Granulocytes Absolute: 0.01 10*3/uL (ref 0.00–0.07)
Immature Granulocytes: 0.4 %
Lymphocytes Absolute Automated: 1.03 10*3/uL (ref 0.42–3.22)
Lymphocytes Automated: 44.6 %
MCH: 29 pg (ref 25.1–33.5)
MCHC: 34.4 g/dL (ref 31.5–35.8)
MCV: 84.3 fL (ref 78.0–96.0)
MPV: 10.6 fL (ref 8.9–12.5)
Monocytes Absolute Automated: 0.14 10*3/uL — ABNORMAL LOW (ref 0.21–0.85)
Monocytes: 6.1 %
Neutrophils Absolute: 1.07 10*3/uL — ABNORMAL LOW (ref 1.10–6.33)
Neutrophils: 46.3 %
Nucleated RBC: 0 /100 WBC (ref 0.0–0.0)
Platelets: 64 10*3/uL — ABNORMAL LOW (ref 142–346)
RBC: 4.52 10*6/uL (ref 3.90–5.10)
RDW: 15 % (ref 11–15)
WBC: 2.31 10*3/uL — ABNORMAL LOW (ref 3.10–9.50)

## 2019-11-04 LAB — TACROLIMUS LEVEL: FK506: 7.6 ng/mL (ref 5.0–20.0)

## 2019-11-04 LAB — PHOSPHORUS: Phosphorus: 4.2 mg/dL (ref 2.3–4.7)

## 2019-11-04 LAB — MAGNESIUM: Magnesium: 1.4 mg/dL — ABNORMAL LOW (ref 1.6–2.6)

## 2019-11-04 LAB — HEMOLYSIS INDEX: Hemolysis Index: 4 (ref 0–18)

## 2019-11-04 LAB — GGT: GGT: 16 U/L (ref 9–36)

## 2019-11-04 LAB — BILIRUBIN, DIRECT: Bilirubin Direct: 0.3 mg/dL (ref 0.0–0.5)

## 2019-11-05 LAB — CYTOMEGALOVIRUS DNA, QUANTITATIVE REAL-TIME PCR
CMV DNA, QN PCR: <2.3 (ref <2.30)
CMV DNA,QN Real Time PCR: <200 (ref <200)

## 2019-11-05 LAB — EBV DNA, QUANTITATIVE PCR
Epstein-Barr Virus DNA Quan PCR Log Copies/ML: 3.74 — ABNORMAL HIGH (ref <2.30)
Epstein-Barr virus, DNA, QUAN PCR: 5516 — ABNORMAL HIGH (ref <200)

## 2019-11-07 ENCOUNTER — Other Ambulatory Visit (FREE_STANDING_LABORATORY_FACILITY): Payer: No Typology Code available for payment source

## 2019-11-07 DIAGNOSIS — Z944 Liver transplant status: Secondary | ICD-10-CM

## 2019-11-07 LAB — CBC AND DIFFERENTIAL
Absolute NRBC: 0 10*3/uL (ref 0.00–0.00)
Basophils Absolute Automated: 0.01 10*3/uL (ref 0.00–0.08)
Basophils Automated: 0.4 %
Eosinophils Absolute Automated: 0.07 10*3/uL (ref 0.00–0.44)
Eosinophils Automated: 3.1 %
Hematocrit: 34.8 % (ref 34.7–43.7)
Hgb: 12.1 g/dL (ref 11.4–14.8)
Immature Granulocytes Absolute: 0.01 10*3/uL (ref 0.00–0.07)
Immature Granulocytes: 0.4 %
Lymphocytes Absolute Automated: 0.92 10*3/uL (ref 0.42–3.22)
Lymphocytes Automated: 40.4 %
MCH: 29.6 pg (ref 25.1–33.5)
MCHC: 34.8 g/dL (ref 31.5–35.8)
MCV: 85.1 fL (ref 78.0–96.0)
MPV: 9.9 fL (ref 8.9–12.5)
Monocytes Absolute Automated: 0.19 10*3/uL — ABNORMAL LOW (ref 0.21–0.85)
Monocytes: 8.3 %
Neutrophils Absolute: 1.08 10*3/uL — ABNORMAL LOW (ref 1.10–6.33)
Neutrophils: 47.4 %
Nucleated RBC: 0 /100 WBC (ref 0.0–0.0)
Platelets: 52 10*3/uL — ABNORMAL LOW (ref 142–346)
RBC: 4.09 10*6/uL (ref 3.90–5.10)
RDW: 15 % (ref 11–15)
WBC: 2.28 10*3/uL — ABNORMAL LOW (ref 3.10–9.50)

## 2019-11-07 LAB — COMPREHENSIVE METABOLIC PANEL
ALT: 30 U/L (ref 0–55)
AST (SGOT): 27 U/L (ref 5–34)
Albumin/Globulin Ratio: 1.4 (ref 0.9–2.2)
Albumin: 4.2 g/dL (ref 3.5–5.0)
Alkaline Phosphatase: 105 U/L (ref 50–130)
Anion Gap: 8 (ref 5.0–15.0)
BUN: 9 mg/dL (ref 7.0–19.0)
Bilirubin, Total: 0.8 mg/dL (ref 0.2–1.2)
CO2: 24 mEq/L (ref 21–29)
Calcium: 9.2 mg/dL (ref 8.5–10.5)
Chloride: 106 mEq/L (ref 100–111)
Creatinine: 1.1 mg/dL (ref 0.4–1.5)
Globulin: 2.9 g/dL (ref 2.0–3.7)
Glucose: 135 mg/dL — ABNORMAL HIGH (ref 70–100)
Potassium: 5 mEq/L (ref 3.5–5.1)
Protein, Total: 7.1 g/dL (ref 6.0–8.3)
Sodium: 138 mEq/L (ref 136–145)

## 2019-11-07 LAB — BILIRUBIN, DIRECT: Bilirubin Direct: 0.3 mg/dL (ref 0.0–0.5)

## 2019-11-07 LAB — GGT: GGT: 13 U/L (ref 9–36)

## 2019-11-07 LAB — TACROLIMUS LEVEL: FK506: 7.1 ng/mL (ref 5.0–20.0)

## 2019-11-07 LAB — PHOSPHORUS: Phosphorus: 4.2 mg/dL (ref 2.3–4.7)

## 2019-11-07 LAB — HEMOLYSIS INDEX: Hemolysis Index: 4 (ref 0–18)

## 2019-11-07 LAB — MAGNESIUM: Magnesium: 1.7 mg/dL (ref 1.6–2.6)

## 2019-11-11 ENCOUNTER — Other Ambulatory Visit (FREE_STANDING_LABORATORY_FACILITY): Payer: No Typology Code available for payment source

## 2019-11-11 DIAGNOSIS — Z944 Liver transplant status: Secondary | ICD-10-CM

## 2019-11-11 LAB — COMPREHENSIVE METABOLIC PANEL
ALT: 36 U/L (ref 0–55)
AST (SGOT): 29 U/L (ref 5–34)
Albumin/Globulin Ratio: 1.4 (ref 0.9–2.2)
Albumin: 4.5 g/dL (ref 3.5–5.0)
Alkaline Phosphatase: 117 U/L (ref 50–130)
Anion Gap: 7 (ref 5.0–15.0)
BUN: 11 mg/dL (ref 7.0–19.0)
Bilirubin, Total: 0.9 mg/dL (ref 0.2–1.2)
CO2: 29 mEq/L (ref 21–29)
Calcium: 9.5 mg/dL (ref 8.5–10.5)
Chloride: 102 mEq/L (ref 100–111)
Creatinine: 1.2 mg/dL (ref 0.4–1.5)
Globulin: 3.3 g/dL (ref 2.0–3.7)
Glucose: 144 mg/dL — ABNORMAL HIGH (ref 70–100)
Potassium: 5.1 mEq/L (ref 3.5–5.1)
Protein, Total: 7.8 g/dL (ref 6.0–8.3)
Sodium: 138 mEq/L (ref 136–145)

## 2019-11-11 LAB — CBC AND DIFFERENTIAL
Absolute NRBC: 0 10*3/uL (ref 0.00–0.00)
Basophils Absolute Automated: 0.01 10*3/uL (ref 0.00–0.08)
Basophils Automated: 0.3 %
Eosinophils Absolute Automated: 0.1 10*3/uL (ref 0.00–0.44)
Eosinophils Automated: 3.3 %
Hematocrit: 38.1 % (ref 34.7–43.7)
Hgb: 13.1 g/dL (ref 11.4–14.8)
Immature Granulocytes Absolute: 0 10*3/uL (ref 0.00–0.07)
Immature Granulocytes: 0 %
Lymphocytes Absolute Automated: 0.96 10*3/uL (ref 0.42–3.22)
Lymphocytes Automated: 32.1 %
MCH: 29.2 pg (ref 25.1–33.5)
MCHC: 34.4 g/dL (ref 31.5–35.8)
MCV: 85 fL (ref 78.0–96.0)
MPV: 10.2 fL (ref 8.9–12.5)
Monocytes Absolute Automated: 0.2 10*3/uL — ABNORMAL LOW (ref 0.21–0.85)
Monocytes: 6.7 %
Neutrophils Absolute: 1.72 10*3/uL (ref 1.10–6.33)
Neutrophils: 57.6 %
Nucleated RBC: 0 /100 WBC (ref 0.0–0.0)
Platelets: 63 10*3/uL — ABNORMAL LOW (ref 142–346)
RBC: 4.48 10*6/uL (ref 3.90–5.10)
RDW: 15 % (ref 11–15)
WBC: 2.99 10*3/uL — ABNORMAL LOW (ref 3.10–9.50)

## 2019-11-11 LAB — TACROLIMUS LEVEL: FK506: 6.3 ng/mL (ref 5.0–20.0)

## 2019-11-11 LAB — BILIRUBIN, DIRECT: Bilirubin Direct: 0.3 mg/dL (ref 0.0–0.5)

## 2019-11-11 LAB — MAGNESIUM: Magnesium: 1.7 mg/dL (ref 1.6–2.6)

## 2019-11-11 LAB — HEMOLYSIS INDEX: Hemolysis Index: 4 (ref 0–18)

## 2019-11-11 LAB — PHOSPHORUS: Phosphorus: 4.4 mg/dL (ref 2.3–4.7)

## 2019-11-11 LAB — GGT: GGT: 20 U/L (ref 9–36)

## 2019-11-12 ENCOUNTER — Telehealth (INDEPENDENT_AMBULATORY_CARE_PROVIDER_SITE_OTHER): Payer: No Typology Code available for payment source | Admitting: Pediatric Pulmonology

## 2019-11-12 LAB — CYTOMEGALOVIRUS DNA, QUANTITATIVE REAL-TIME PCR
CMV DNA, QN PCR: <2.3 (ref <2.30)
CMV DNA,QN Real Time PCR: <200 (ref <200)

## 2019-11-13 LAB — EBV DNA, QUANTITATIVE PCR
Epstein-Barr Virus DNA Quan PCR Log Copies/ML: 3.74 — ABNORMAL HIGH (ref <2.30)
Epstein-Barr virus, DNA, QUAN PCR: 5516 — ABNORMAL HIGH (ref <200)

## 2019-11-14 ENCOUNTER — Other Ambulatory Visit (FREE_STANDING_LABORATORY_FACILITY): Payer: No Typology Code available for payment source

## 2019-11-14 DIAGNOSIS — Z944 Liver transplant status: Secondary | ICD-10-CM

## 2019-11-14 LAB — CBC AND DIFFERENTIAL
Absolute NRBC: 0 10*3/uL (ref 0.00–0.00)
Basophils Absolute Automated: 0.01 10*3/uL (ref 0.00–0.08)
Basophils Automated: 0.4 %
Eosinophils Absolute Automated: 0.08 10*3/uL (ref 0.00–0.44)
Eosinophils Automated: 3 %
Hematocrit: 34.5 % — ABNORMAL LOW (ref 34.7–43.7)
Hgb: 12.3 g/dL (ref 11.4–14.8)
Immature Granulocytes Absolute: 0.03 10*3/uL (ref 0.00–0.07)
Immature Granulocytes: 1.1 %
Lymphocytes Absolute Automated: 1.02 10*3/uL (ref 0.42–3.22)
Lymphocytes Automated: 37.8 %
MCH: 29.6 pg (ref 25.1–33.5)
MCHC: 35.7 g/dL (ref 31.5–35.8)
MCV: 82.9 fL (ref 78.0–96.0)
MPV: 10.2 fL (ref 8.9–12.5)
Monocytes Absolute Automated: 0.2 10*3/uL — ABNORMAL LOW (ref 0.21–0.85)
Monocytes: 7.4 %
Neutrophils Absolute: 1.36 10*3/uL (ref 1.10–6.33)
Neutrophils: 50.3 %
Nucleated RBC: 0 /100 WBC (ref 0.0–0.0)
Platelets: 58 10*3/uL — ABNORMAL LOW (ref 142–346)
RBC: 4.16 10*6/uL (ref 3.90–5.10)
RDW: 15 % (ref 11–15)
WBC: 2.7 10*3/uL — ABNORMAL LOW (ref 3.10–9.50)

## 2019-11-14 LAB — COMPREHENSIVE METABOLIC PANEL
ALT: 36 U/L (ref 0–55)
AST (SGOT): 33 U/L (ref 5–34)
Albumin/Globulin Ratio: 1.4 (ref 0.9–2.2)
Albumin: 4.2 g/dL (ref 3.5–5.0)
Alkaline Phosphatase: 106 U/L (ref 50–130)
Anion Gap: 10 (ref 5.0–15.0)
BUN: 11 mg/dL (ref 7.0–19.0)
Bilirubin, Total: 0.7 mg/dL (ref 0.2–1.2)
CO2: 24 mEq/L (ref 21–29)
Calcium: 9.2 mg/dL (ref 8.5–10.5)
Chloride: 101 mEq/L (ref 100–111)
Creatinine: 1 mg/dL (ref 0.4–1.5)
Globulin: 3 g/dL (ref 2.0–3.7)
Glucose: 125 mg/dL — ABNORMAL HIGH (ref 70–100)
Potassium: 5 mEq/L (ref 3.5–5.1)
Protein, Total: 7.2 g/dL (ref 6.0–8.3)
Sodium: 135 mEq/L — ABNORMAL LOW (ref 136–145)

## 2019-11-14 LAB — BILIRUBIN, DIRECT: Bilirubin Direct: 0.3 mg/dL (ref 0.0–0.5)

## 2019-11-14 LAB — MAGNESIUM: Magnesium: 1.9 mg/dL (ref 1.6–2.6)

## 2019-11-14 LAB — HEMOLYSIS INDEX: Hemolysis Index: 9 (ref 0–18)

## 2019-11-14 LAB — PHOSPHORUS: Phosphorus: 4.4 mg/dL (ref 2.3–4.7)

## 2019-11-14 LAB — GGT: GGT: 17 U/L (ref 9–36)

## 2019-11-14 LAB — TACROLIMUS LEVEL: FK506: 8.3 ng/mL (ref 5.0–20.0)

## 2020-01-13 ENCOUNTER — Other Ambulatory Visit (FREE_STANDING_LABORATORY_FACILITY): Payer: No Typology Code available for payment source

## 2020-01-13 DIAGNOSIS — Z944 Liver transplant status: Secondary | ICD-10-CM

## 2020-01-13 LAB — COMPREHENSIVE METABOLIC PANEL
ALT: 42 U/L (ref 0–55)
AST (SGOT): 36 U/L — ABNORMAL HIGH (ref 5–34)
Albumin/Globulin Ratio: 1.3 (ref 0.9–2.2)
Albumin: 4.1 g/dL (ref 3.5–5.0)
Alkaline Phosphatase: 109 U/L (ref 50–130)
Anion Gap: 11 (ref 5.0–15.0)
BUN: 7 mg/dL (ref 7.0–19.0)
Bilirubin, Total: 0.7 mg/dL (ref 0.2–1.2)
CO2: 25 mEq/L (ref 21–29)
Calcium: 9.3 mg/dL (ref 8.5–10.5)
Chloride: 104 mEq/L (ref 100–111)
Creatinine: 0.8 mg/dL (ref 0.4–1.5)
Globulin: 3.2 g/dL (ref 2.0–3.7)
Glucose: 97 mg/dL (ref 70–100)
Potassium: 4.5 mEq/L (ref 3.5–5.1)
Protein, Total: 7.3 g/dL (ref 6.0–8.3)
Sodium: 140 mEq/L (ref 136–145)

## 2020-01-13 LAB — GGT: GGT: 24 U/L (ref 10–70)

## 2020-01-13 LAB — HEMOLYSIS INDEX: Hemolysis Index: 4 (ref 0–24)

## 2020-01-13 LAB — CBC AND DIFFERENTIAL
Absolute NRBC: 0 10*3/uL (ref 0.00–0.00)
Basophils Absolute Automated: 0.01 10*3/uL (ref 0.00–0.08)
Basophils Automated: 0.3 %
Eosinophils Absolute Automated: 0.11 10*3/uL (ref 0.00–0.44)
Eosinophils Automated: 3.3 %
Hematocrit: 36 % (ref 34.7–43.7)
Hgb: 12.4 g/dL (ref 11.4–14.8)
Immature Granulocytes Absolute: 0.01 10*3/uL (ref 0.00–0.07)
Immature Granulocytes: 0.3 %
Lymphocytes Absolute Automated: 1.09 10*3/uL (ref 0.42–3.22)
Lymphocytes Automated: 32.2 %
MCH: 28.7 pg (ref 25.1–33.5)
MCHC: 34.4 g/dL (ref 31.5–35.8)
MCV: 83.3 fL (ref 78.0–96.0)
MPV: 10 fL (ref 8.9–12.5)
Monocytes Absolute Automated: 0.24 10*3/uL (ref 0.21–0.85)
Monocytes: 7.1 %
Neutrophils Absolute: 1.92 10*3/uL (ref 1.10–6.33)
Neutrophils: 56.8 %
Nucleated RBC: 0 /100 WBC (ref 0.0–0.0)
Platelets: 83 10*3/uL — ABNORMAL LOW (ref 142–346)
RBC: 4.32 10*6/uL (ref 3.90–5.10)
RDW: 13 % (ref 11–15)
WBC: 3.38 10*3/uL (ref 3.10–9.50)

## 2020-01-13 LAB — PHOSPHORUS: Phosphorus: 3.8 mg/dL (ref 2.3–4.7)

## 2020-01-13 LAB — BILIRUBIN, DIRECT: Bilirubin Direct: 0.2 mg/dL (ref 0.0–0.5)

## 2020-01-13 LAB — MAGNESIUM: Magnesium: 1.6 mg/dL (ref 1.6–2.6)

## 2020-01-13 LAB — TACROLIMUS LEVEL: FK506: 5.4 ng/mL (ref 5.0–20.0)

## 2020-01-14 LAB — CYTOMEGALOVIRUS DNA, QUANTITATIVE REAL-TIME PCR
CMV DNA, QN PCR: NOT DETECTED
CMV DNA,QN Real Time PCR: NOT DETECTED

## 2020-01-15 LAB — EBV DNA, QUANTITATIVE PCR
Epstein-Barr Virus DNA Quan PCR Log Copies/ML: 3.83 — ABNORMAL HIGH (ref <2.30)
Epstein-Barr virus, DNA, QUAN PCR: 6787 — ABNORMAL HIGH (ref <200)

## 2020-01-18 ENCOUNTER — Ambulatory Visit
Admission: RE | Admit: 2020-01-18 | Discharge: 2020-01-18 | Disposition: A | Discharge status: Home / Self Care | Payer: No Typology Code available for payment source | Source: Ambulatory Visit | Attending: Pediatrics | Admitting: Pediatrics

## 2020-01-18 DIAGNOSIS — Z0000 Encounter for general adult medical examination without abnormal findings: Secondary | ICD-10-CM | POA: Insufficient documentation

## 2020-01-18 LAB — HEMOLYSIS INDEX: Hemolysis Index: 5 (ref 0–24)

## 2020-01-18 LAB — COMPREHENSIVE METABOLIC PANEL
ALT: 24 U/L (ref 0–55)
AST (SGOT): 25 U/L (ref 5–34)
Albumin/Globulin Ratio: 1.3 (ref 0.9–2.2)
Albumin: 4.1 g/dL (ref 3.5–5.0)
Alkaline Phosphatase: 93 U/L (ref 50–130)
Anion Gap: 8 (ref 5.0–15.0)
BUN: 8 mg/dL (ref 7.0–19.0)
Bilirubin, Total: 0.8 mg/dL (ref 0.2–1.2)
CO2: 26 mEq/L (ref 21–29)
Calcium: 9 mg/dL (ref 8.5–10.5)
Chloride: 105 mEq/L (ref 100–111)
Creatinine: 0.8 mg/dL (ref 0.4–1.5)
Globulin: 3.2 g/dL (ref 2.0–3.7)
Glucose: 126 mg/dL — ABNORMAL HIGH (ref 70–100)
Potassium: 4.5 mEq/L (ref 3.5–5.1)
Protein, Total: 7.3 g/dL (ref 6.0–8.3)
Sodium: 139 mEq/L (ref 136–145)

## 2020-01-18 LAB — PHOSPHORUS: Phosphorus: 3.5 mg/dL (ref 2.3–4.7)

## 2020-01-18 LAB — CBC AND DIFFERENTIAL
Absolute NRBC: 0 10*3/uL (ref 0.00–0.00)
Basophils Absolute Automated: 0.01 10*3/uL (ref 0.00–0.08)
Basophils Automated: 0.3 %
Eosinophils Absolute Automated: 0.09 10*3/uL (ref 0.00–0.44)
Eosinophils Automated: 2.7 %
Hematocrit: 35.6 % (ref 34.7–43.7)
Hgb: 12.1 g/dL (ref 11.4–14.8)
Immature Granulocytes Absolute: 0.01 10*3/uL (ref 0.00–0.07)
Immature Granulocytes: 0.3 %
Lymphocytes Absolute Automated: 0.83 10*3/uL (ref 0.42–3.22)
Lymphocytes Automated: 24.9 %
MCH: 28.3 pg (ref 25.1–33.5)
MCHC: 34 g/dL (ref 31.5–35.8)
MCV: 83.4 fL (ref 78.0–96.0)
MPV: 10.1 fL (ref 8.9–12.5)
Monocytes Absolute Automated: 0.26 10*3/uL (ref 0.21–0.85)
Monocytes: 7.8 %
Neutrophils Absolute: 2.13 10*3/uL (ref 1.10–6.33)
Neutrophils: 64 %
Nucleated RBC: 0 /100 WBC (ref 0.0–0.0)
Platelets: 76 10*3/uL — ABNORMAL LOW (ref 142–346)
RBC: 4.27 10*6/uL (ref 3.90–5.10)
RDW: 13 % (ref 11–15)
WBC: 3.33 10*3/uL (ref 3.10–9.50)

## 2020-01-18 LAB — TACROLIMUS LEVEL: FK506: 3.6 ng/mL — ABNORMAL LOW (ref 5.0–20.0)

## 2020-01-18 LAB — MAGNESIUM: Magnesium: 1.8 mg/dL (ref 1.6–2.6)

## 2020-01-18 LAB — GGT: GGT: 14 U/L (ref 10–70)

## 2020-01-18 LAB — BILIRUBIN, DIRECT: Bilirubin Direct: 0.3 mg/dL (ref 0.0–0.5)

## 2020-01-20 LAB — MISCELLANEOUS MAYO TEST

## 2020-01-21 LAB — CYTOMEGALOVIRUS DNA, QUANTITATIVE REAL-TIME PCR
CMV DNA, QN PCR: NOT DETECTED
CMV DNA,QN Real Time PCR: NOT DETECTED

## 2020-01-22 LAB — EBV DNA, QUANTITATIVE PCR
Epstein-Barr Virus DNA Quan PCR Log Copies/ML: 3.23 — ABNORMAL HIGH (ref <2.30)
Epstein-Barr virus, DNA, QUAN PCR: 1703 — ABNORMAL HIGH (ref <200)

## 2020-03-13 ENCOUNTER — Other Ambulatory Visit (FREE_STANDING_LABORATORY_FACILITY): Payer: No Typology Code available for payment source

## 2020-03-13 DIAGNOSIS — Z940 Kidney transplant status: Secondary | ICD-10-CM

## 2020-03-13 LAB — CBC AND DIFFERENTIAL
Absolute NRBC: 0 10*3/uL (ref 0.00–0.00)
Basophils Absolute Automated: 0.02 10*3/uL (ref 0.00–0.08)
Basophils Automated: 0.4 %
Eosinophils Absolute Automated: 0.27 10*3/uL (ref 0.00–0.44)
Eosinophils Automated: 4.9 %
Hematocrit: 41.8 % (ref 34.7–43.7)
Hgb: 14 g/dL (ref 11.4–14.8)
Immature Granulocytes Absolute: 0.01 10*3/uL (ref 0.00–0.07)
Immature Granulocytes: 0.2 %
Lymphocytes Absolute Automated: 1.16 10*3/uL (ref 0.42–3.22)
Lymphocytes Automated: 21.1 %
MCH: 26.9 pg (ref 25.1–33.5)
MCHC: 33.5 g/dL (ref 31.5–35.8)
MCV: 80.4 fL (ref 78.0–96.0)
MPV: 9.5 fL (ref 8.9–12.5)
Monocytes Absolute Automated: 0.25 10*3/uL (ref 0.21–0.85)
Monocytes: 4.5 %
Neutrophils Absolute: 3.8 10*3/uL (ref 1.10–6.33)
Neutrophils: 68.9 %
Nucleated RBC: 0 /100 WBC (ref 0.0–0.0)
Platelets: 91 10*3/uL — ABNORMAL LOW (ref 142–346)
RBC: 5.2 10*6/uL — ABNORMAL HIGH (ref 3.90–5.10)
RDW: 15 % (ref 11–15)
WBC: 5.51 10*3/uL (ref 3.10–9.50)

## 2020-03-13 LAB — COMPREHENSIVE METABOLIC PANEL
ALT: 19 U/L (ref 0–55)
AST (SGOT): 21 U/L (ref 5–34)
Albumin/Globulin Ratio: 1.2 (ref 0.9–2.2)
Albumin: 4.1 g/dL (ref 3.5–5.0)
Alkaline Phosphatase: 105 U/L (ref 50–130)
Anion Gap: 9 (ref 5.0–15.0)
BUN: 7 mg/dL (ref 7.0–19.0)
Bilirubin, Total: 0.9 mg/dL (ref 0.2–1.2)
CO2: 25 mEq/L (ref 21–29)
Calcium: 8.5 mg/dL (ref 8.5–10.5)
Chloride: 105 mEq/L (ref 100–111)
Creatinine: 0.8 mg/dL (ref 0.4–1.5)
Globulin: 3.3 g/dL (ref 2.0–3.7)
Glucose: 108 mg/dL — ABNORMAL HIGH (ref 70–100)
Potassium: 4.9 mEq/L (ref 3.5–5.1)
Protein, Total: 7.4 g/dL (ref 6.0–8.3)
Sodium: 139 mEq/L (ref 136–145)

## 2020-03-13 LAB — MAGNESIUM: Magnesium: 1.7 mg/dL (ref 1.6–2.6)

## 2020-03-13 LAB — TACROLIMUS LEVEL: FK506: 4.3 ng/mL — ABNORMAL LOW (ref 5.0–20.0)

## 2020-03-13 LAB — HEMOLYSIS INDEX: Hemolysis Index: 3 (ref 0–24)

## 2020-03-13 LAB — PHOSPHORUS: Phosphorus: 4.2 mg/dL (ref 2.3–4.7)

## 2020-03-13 LAB — GGT: GGT: 14 U/L (ref 10–70)

## 2020-03-13 LAB — BILIRUBIN, DIRECT: Bilirubin Direct: 0.3 mg/dL (ref 0.0–0.5)

## 2020-03-16 LAB — EBV DNA, QUANTITATIVE PCR
Epstein-Barr Virus DNA Quan PCR Log Copies/ML: 2.78 — ABNORMAL HIGH (ref <2.30)
Epstein-Barr virus, DNA, QUAN PCR: 604 — ABNORMAL HIGH (ref <200)

## 2020-03-19 ENCOUNTER — Other Ambulatory Visit (FREE_STANDING_LABORATORY_FACILITY): Payer: No Typology Code available for payment source

## 2020-03-19 DIAGNOSIS — Z944 Liver transplant status: Secondary | ICD-10-CM

## 2020-03-19 LAB — COMPREHENSIVE METABOLIC PANEL
ALT: 22 U/L (ref 0–55)
AST (SGOT): 22 U/L (ref 5–34)
Albumin/Globulin Ratio: 1.3 (ref 0.9–2.2)
Albumin: 3.9 g/dL (ref 3.5–5.0)
Alkaline Phosphatase: 82 U/L (ref 50–130)
Anion Gap: 5 (ref 5.0–15.0)
BUN: 8 mg/dL (ref 7.0–19.0)
Bilirubin, Total: 0.7 mg/dL (ref 0.2–1.2)
CO2: 26 mEq/L (ref 21–29)
Calcium: 8.5 mg/dL (ref 8.5–10.5)
Chloride: 108 mEq/L (ref 100–111)
Creatinine: 1 mg/dL (ref 0.4–1.5)
Globulin: 3.1 g/dL (ref 2.0–3.7)
Glucose: 112 mg/dL — ABNORMAL HIGH (ref 70–100)
Potassium: 4.8 mEq/L (ref 3.5–5.1)
Protein, Total: 7 g/dL (ref 6.0–8.3)
Sodium: 139 mEq/L (ref 136–145)

## 2020-03-19 LAB — BILIRUBIN, DIRECT: Bilirubin Direct: 0.2 mg/dL (ref 0.0–0.5)

## 2020-03-19 LAB — CBC AND DIFFERENTIAL
Absolute NRBC: 0 10*3/uL (ref 0.00–0.00)
Basophils Absolute Automated: 0.01 10*3/uL (ref 0.00–0.08)
Basophils Automated: 0.2 %
Eosinophils Absolute Automated: 0.15 10*3/uL (ref 0.00–0.44)
Eosinophils Automated: 3.7 %
Hematocrit: 36.6 % (ref 34.7–43.7)
Hgb: 12.3 g/dL (ref 11.4–14.8)
Immature Granulocytes Absolute: 0.01 10*3/uL (ref 0.00–0.07)
Immature Granulocytes: 0.2 %
Lymphocytes Absolute Automated: 0.99 10*3/uL (ref 0.42–3.22)
Lymphocytes Automated: 24.3 %
MCH: 26.7 pg (ref 25.1–33.5)
MCHC: 33.6 g/dL (ref 31.5–35.8)
MCV: 79.4 fL (ref 78.0–96.0)
MPV: 9.6 fL (ref 8.9–12.5)
Monocytes Absolute Automated: 0.28 10*3/uL (ref 0.21–0.85)
Monocytes: 6.9 %
Neutrophils Absolute: 2.64 10*3/uL (ref 1.10–6.33)
Neutrophils: 64.7 %
Nucleated RBC: 0 /100 WBC (ref 0.0–0.0)
Platelets: 87 10*3/uL — ABNORMAL LOW (ref 142–346)
RBC: 4.61 10*6/uL (ref 3.90–5.10)
RDW: 15 % (ref 11–15)
WBC: 4.08 10*3/uL (ref 3.10–9.50)

## 2020-03-19 LAB — TACROLIMUS LEVEL: FK506: 4.5 ng/mL — ABNORMAL LOW (ref 5.0–20.0)

## 2020-03-19 LAB — MAGNESIUM: Magnesium: 1.9 mg/dL (ref 1.6–2.6)

## 2020-03-19 LAB — HEMOLYSIS INDEX: Hemolysis Index: 8 (ref 0–24)

## 2020-03-19 LAB — PHOSPHORUS: Phosphorus: 4.2 mg/dL (ref 2.3–4.7)

## 2020-03-23 LAB — EBV DNA, QUANTITATIVE PCR
Epstein-Barr Virus DNA Quan PCR Log Copies/ML: 2.78 — ABNORMAL HIGH (ref <2.30)
Epstein-Barr virus, DNA, QUAN PCR: 604 — ABNORMAL HIGH (ref <200)

## 2020-03-23 LAB — CYTOMEGALOVIRUS DNA, QUANTITATIVE REAL-TIME PCR
CMV DNA, QN PCR: NOT DETECTED
CMV DNA,QN Real Time PCR: NOT DETECTED

## 2020-03-31 ENCOUNTER — Other Ambulatory Visit (FREE_STANDING_LABORATORY_FACILITY): Payer: No Typology Code available for payment source

## 2020-03-31 DIAGNOSIS — Z944 Liver transplant status: Secondary | ICD-10-CM

## 2020-03-31 LAB — CBC AND DIFFERENTIAL
Absolute NRBC: 0 10*3/uL (ref 0.00–0.00)
Basophils Absolute Automated: 0.02 10*3/uL (ref 0.00–0.08)
Basophils Automated: 0.5 %
Eosinophils Absolute Automated: 0.1 10*3/uL (ref 0.00–0.44)
Eosinophils Automated: 2.6 %
Hematocrit: 39.2 % (ref 34.7–43.7)
Hgb: 13 g/dL (ref 11.4–14.8)
Immature Granulocytes Absolute: 0.01 10*3/uL (ref 0.00–0.07)
Immature Granulocytes: 0.3 %
Lymphocytes Absolute Automated: 1 10*3/uL (ref 0.42–3.22)
Lymphocytes Automated: 26.1 %
MCH: 26.7 pg (ref 25.1–33.5)
MCHC: 33.2 g/dL (ref 31.5–35.8)
MCV: 80.7 fL (ref 78.0–96.0)
MPV: 9.7 fL (ref 8.9–12.5)
Monocytes Absolute Automated: 0.19 10*3/uL — ABNORMAL LOW (ref 0.21–0.85)
Monocytes: 5 %
Neutrophils Absolute: 2.51 10*3/uL (ref 1.10–6.33)
Neutrophils: 65.5 %
Nucleated RBC: 0 /100 WBC (ref 0.0–0.0)
Platelets: 93 10*3/uL — ABNORMAL LOW (ref 142–346)
RBC: 4.86 10*6/uL (ref 3.90–5.10)
RDW: 15 % (ref 11–15)
WBC: 3.83 10*3/uL (ref 3.10–9.50)

## 2020-03-31 LAB — COMPREHENSIVE METABOLIC PANEL
ALT: 18 U/L (ref 0–55)
AST (SGOT): 18 U/L (ref 5–34)
Albumin/Globulin Ratio: 1.3 (ref 0.9–2.2)
Albumin: 4.2 g/dL (ref 3.5–5.0)
Alkaline Phosphatase: 81 U/L (ref 37–117)
Anion Gap: 9 (ref 5.0–15.0)
BUN: 8 mg/dL (ref 7.0–19.0)
Bilirubin, Total: 0.7 mg/dL (ref 0.2–1.2)
CO2: 24 mEq/L (ref 21–29)
Calcium: 9.1 mg/dL (ref 8.5–10.5)
Chloride: 108 mEq/L (ref 100–111)
Creatinine: 0.8 mg/dL (ref 0.4–1.5)
Globulin: 3.3 g/dL (ref 2.0–3.7)
Glucose: 99 mg/dL (ref 70–100)
Potassium: 4.6 mEq/L (ref 3.5–5.1)
Protein, Total: 7.5 g/dL (ref 6.0–8.3)
Sodium: 141 mEq/L (ref 136–145)

## 2020-03-31 LAB — GFR: EGFR: >60

## 2020-03-31 LAB — HEMOLYSIS INDEX: Hemolysis Index: 3 (ref 0–24)

## 2020-03-31 LAB — PHOSPHORUS: Phosphorus: 4.4 mg/dL (ref 2.3–4.7)

## 2020-03-31 LAB — MAGNESIUM: Magnesium: 1.6 mg/dL (ref 1.6–2.6)

## 2020-03-31 LAB — GGT: GGT: 22 U/L (ref 10–70)

## 2020-03-31 LAB — BILIRUBIN, DIRECT: Bilirubin Direct: 0.3 mg/dL (ref 0.0–0.5)

## 2020-04-01 LAB — SARS COV 2 ANTIBODY IGG: SARS CoV 2 Antibody IgG: POSITIVE — AB

## 2020-04-01 LAB — PATHOLOGY REVIEW - COVID ANTIBODIES

## 2020-04-02 LAB — EBV DNA, QUANTITATIVE PCR
Epstein-Barr Virus DNA Quan PCR Log Copies/ML: 3.59 — ABNORMAL HIGH (ref <2.30)
Epstein-Barr virus, DNA, QUAN PCR: 3904 — ABNORMAL HIGH (ref <200)

## 2020-04-02 LAB — CYTOMEGALOVIRUS DNA, QUANTITATIVE REAL-TIME PCR
CMV DNA, QN PCR: NOT DETECTED
CMV DNA,QN Real Time PCR: NOT DETECTED

## 2020-04-02 LAB — VITAMIN A: Vitamin A: 33.3 (ref 32.5–78.0)

## 2020-04-03 LAB — TACROLIMUS LEVEL: FK506: 4 ng/mL — ABNORMAL LOW (ref 5.0–20.0)

## 2020-04-03 LAB — VITAMIN E: Vitamin E (Alpha Tocopherol): 9.7 mg/L (ref 5.5–17.0)

## 2020-04-06 LAB — RETINOL BINDING PROTEIN: Retinol Binding Protein: 3 mg/dL (ref 1.5–6.7)

## 2020-04-07 ENCOUNTER — Other Ambulatory Visit (FREE_STANDING_LABORATORY_FACILITY): Payer: No Typology Code available for payment source

## 2020-04-07 DIAGNOSIS — Z944 Liver transplant status: Secondary | ICD-10-CM

## 2020-04-07 LAB — COMPREHENSIVE METABOLIC PANEL
ALT: 24 U/L (ref 0–55)
AST (SGOT): 32 U/L (ref 5–34)
Albumin/Globulin Ratio: 1.3 (ref 0.9–2.2)
Albumin: 3.9 g/dL (ref 3.5–5.0)
Alkaline Phosphatase: 87 U/L (ref 37–117)
Anion Gap: 7 (ref 5.0–15.0)
BUN: 7 mg/dL (ref 7.0–19.0)
Bilirubin, Total: 0.8 mg/dL (ref 0.2–1.2)
CO2: 25 mEq/L (ref 21–29)
Calcium: 9.1 mg/dL (ref 8.5–10.5)
Chloride: 103 mEq/L (ref 100–111)
Creatinine: 0.9 mg/dL (ref 0.4–1.5)
Globulin: 3.1 g/dL (ref 2.0–3.7)
Glucose: 101 mg/dL — ABNORMAL HIGH (ref 70–100)
Potassium: 4.5 mEq/L (ref 3.5–5.1)
Protein, Total: 7 g/dL (ref 6.0–8.3)
Sodium: 135 mEq/L — ABNORMAL LOW (ref 136–145)

## 2020-04-07 LAB — CBC AND DIFFERENTIAL
Absolute NRBC: 0 10*3/uL (ref 0.00–0.00)
Basophils Absolute Automated: 0.02 10*3/uL (ref 0.00–0.08)
Basophils Automated: 0.7 %
Eosinophils Absolute Automated: 0.19 10*3/uL (ref 0.00–0.44)
Eosinophils Automated: 6.6 %
Hematocrit: 39.1 % (ref 34.7–43.7)
Hgb: 13.1 g/dL (ref 11.4–14.8)
Immature Granulocytes Absolute: 0 10*3/uL (ref 0.00–0.07)
Immature Granulocytes: 0 %
Lymphocytes Absolute Automated: 1.15 10*3/uL (ref 0.42–3.22)
Lymphocytes Automated: 39.9 %
MCH: 27.3 pg (ref 25.1–33.5)
MCHC: 33.5 g/dL (ref 31.5–35.8)
MCV: 81.6 fL (ref 78.0–96.0)
MPV: 10.1 fL (ref 8.9–12.5)
Monocytes Absolute Automated: 0.21 10*3/uL (ref 0.21–0.85)
Monocytes: 7.3 %
Neutrophils Absolute: 1.31 10*3/uL (ref 1.10–6.33)
Neutrophils: 45.5 %
Nucleated RBC: 0 /100 WBC (ref 0.0–0.0)
Platelets: 74 10*3/uL — ABNORMAL LOW (ref 142–346)
RBC: 4.79 10*6/uL (ref 3.90–5.10)
RDW: 15 % (ref 11–15)
WBC: 2.88 10*3/uL — ABNORMAL LOW (ref 3.10–9.50)

## 2020-04-07 LAB — HEPATIC FUNCTION PANEL
ALT: 24 U/L (ref 0–55)
AST (SGOT): 31 U/L (ref 5–34)
Albumin/Globulin Ratio: 1.4 (ref 0.9–2.2)
Albumin: 4.1 g/dL (ref 3.5–5.0)
Alkaline Phosphatase: 83 U/L (ref 37–117)
Bilirubin Direct: 0.2 mg/dL (ref 0.0–0.5)
Bilirubin Indirect: 0.6 mg/dL (ref 0.2–1.0)
Bilirubin, Total: 0.8 mg/dL (ref 0.2–1.2)
Globulin: 2.9 g/dL (ref 2.0–3.7)
Protein, Total: 7 g/dL (ref 6.0–8.3)

## 2020-04-07 LAB — TOBRAMYCIN LEVEL, TROUGH
Tobramycin Date of Last Dose: 1102022
Tobramycin Tr: 0.9 ug/mL (ref 0.2–2.0)

## 2020-04-07 LAB — MAGNESIUM: Magnesium: 1.6 mg/dL (ref 1.6–2.6)

## 2020-04-07 LAB — GGT: GGT: 9 U/L — ABNORMAL LOW (ref 10–70)

## 2020-04-07 LAB — HEMOLYSIS INDEX
Hemolysis Index: 9 (ref 0–24)
Hemolysis Index: 1 (ref 0–24)

## 2020-04-07 LAB — PHOSPHORUS: Phosphorus: 3.5 mg/dL (ref 2.3–4.7)

## 2020-04-07 LAB — GFR: EGFR: >60

## 2020-04-07 LAB — BILIRUBIN, DIRECT: Bilirubin Direct: 0.2 mg/dL (ref 0.0–0.5)

## 2020-04-09 LAB — CYTOMEGALOVIRUS DNA, QUANTITATIVE REAL-TIME PCR
CMV DNA, QN PCR: NOT DETECTED
CMV DNA,QN Real Time PCR: NOT DETECTED

## 2020-04-09 LAB — EBV DNA, QUANTITATIVE PCR
Epstein-Barr Virus DNA Quan PCR Log Copies/ML: 3.62 — ABNORMAL HIGH (ref <2.30)
Epstein-Barr virus, DNA, QUAN PCR: 4183 — ABNORMAL HIGH (ref <200)

## 2020-04-09 LAB — TACROLIMUS LEVEL: FK506: 3.4 ng/mL — ABNORMAL LOW (ref 5.0–20.0)

## 2020-04-10 ENCOUNTER — Other Ambulatory Visit (FREE_STANDING_LABORATORY_FACILITY): Payer: No Typology Code available for payment source

## 2020-04-10 DIAGNOSIS — Z944 Liver transplant status: Secondary | ICD-10-CM

## 2020-04-10 LAB — CBC AND DIFFERENTIAL
Absolute NRBC: 0 10*3/uL (ref 0.00–0.00)
Basophils Absolute Automated: 0.02 10*3/uL (ref 0.00–0.08)
Basophils Automated: 0.7 %
Eosinophils Absolute Automated: 0.15 10*3/uL (ref 0.00–0.44)
Eosinophils Automated: 5.2 %
Hematocrit: 42 % (ref 34.7–43.7)
Hgb: 13.7 g/dL (ref 11.4–14.8)
Immature Granulocytes Absolute: 0.01 10*3/uL (ref 0.00–0.07)
Immature Granulocytes: 0.3 %
Lymphocytes Absolute Automated: 1.05 10*3/uL (ref 0.42–3.22)
Lymphocytes Automated: 36.1 %
MCH: 26.5 pg (ref 25.1–33.5)
MCHC: 32.6 g/dL (ref 31.5–35.8)
MCV: 81.2 fL (ref 78.0–96.0)
MPV: 9.9 fL (ref 8.9–12.5)
Monocytes Absolute Automated: 0.22 10*3/uL (ref 0.21–0.85)
Monocytes: 7.6 %
Neutrophils Absolute: 1.46 10*3/uL (ref 1.10–6.33)
Neutrophils: 50.1 %
Nucleated RBC: 0 /100 WBC (ref 0.0–0.0)
Platelets: 87 10*3/uL — ABNORMAL LOW (ref 142–346)
RBC: 5.17 10*6/uL — ABNORMAL HIGH (ref 3.90–5.10)
RDW: 15 % (ref 11–15)
WBC: 2.91 10*3/uL — ABNORMAL LOW (ref 3.10–9.50)

## 2020-04-10 LAB — COMPREHENSIVE METABOLIC PANEL
ALT: 36 U/L (ref 0–55)
AST (SGOT): 61 U/L — ABNORMAL HIGH (ref 5–34)
Albumin/Globulin Ratio: 1.2 (ref 0.9–2.2)
Albumin: 4.2 g/dL (ref 3.5–5.0)
Alkaline Phosphatase: 82 U/L (ref 37–117)
Anion Gap: 10 (ref 5.0–15.0)
BUN: 6 mg/dL — ABNORMAL LOW (ref 7.0–19.0)
Bilirubin, Total: 0.7 mg/dL (ref 0.2–1.2)
CO2: 23 mEq/L (ref 21–29)
Calcium: 9.3 mg/dL (ref 8.5–10.5)
Chloride: 106 mEq/L (ref 100–111)
Creatinine: 0.9 mg/dL (ref 0.4–1.5)
Globulin: 3.4 g/dL (ref 2.0–3.7)
Glucose: 109 mg/dL — ABNORMAL HIGH (ref 70–100)
Protein, Total: 7.6 g/dL (ref 6.0–8.3)
Sodium: 139 mEq/L (ref 136–145)

## 2020-04-10 LAB — MAGNESIUM: Magnesium: 1.7 mg/dL (ref 1.6–2.6)

## 2020-04-10 LAB — TOBRAMYCIN LEVEL, PEAK
Tobramycin Date of Last Dose: 1132022
Tobramycin Pk: 0.5 ug/mL — ABNORMAL LOW (ref 5.0–10.0)

## 2020-04-10 LAB — GFR: EGFR: >60

## 2020-04-10 LAB — TACROLIMUS LEVEL: FK506: 4.3 ng/mL — ABNORMAL LOW (ref 5.0–20.0)

## 2020-04-10 LAB — PHOSPHORUS: Phosphorus: 4.6 mg/dL (ref 2.3–4.7)

## 2020-04-10 LAB — BILIRUBIN, DIRECT: Bilirubin Direct: 0.2 mg/dL (ref 0.0–0.5)

## 2020-04-10 LAB — HEMOLYSIS INDEX: Hemolysis Index: 69 — ABNORMAL HIGH (ref 0–24)

## 2020-04-10 LAB — GGT: GGT: 10 U/L (ref 10–70)

## 2020-04-12 LAB — CYTOMEGALOVIRUS DNA, QUANTITATIVE REAL-TIME PCR
CMV DNA, QN PCR: NOT DETECTED
CMV DNA,QN Real Time PCR: NOT DETECTED

## 2020-04-15 LAB — EBV DNA, QUANTITATIVE PCR
Epstein-Barr Virus DNA Quan PCR Log Copies/ML: 3.92 — ABNORMAL HIGH (ref <2.30)
Epstein-Barr virus, DNA, QUAN PCR: 8350 — ABNORMAL HIGH (ref <200)

## 2020-05-01 ENCOUNTER — Ambulatory Visit (INDEPENDENT_AMBULATORY_CARE_PROVIDER_SITE_OTHER): Payer: No Typology Code available for payment source | Admitting: Pediatric Pulmonology

## 2020-05-01 VITALS — Ht 62.84 in | Wt 109.1 lb

## 2020-05-01 DIAGNOSIS — J151 Pneumonia due to Pseudomonas: Secondary | ICD-10-CM

## 2020-05-01 DIAGNOSIS — D849 Immunodeficiency, unspecified: Secondary | ICD-10-CM

## 2020-05-01 DIAGNOSIS — K8689 Other specified diseases of pancreas: Secondary | ICD-10-CM

## 2020-05-01 DIAGNOSIS — Z944 Liver transplant status: Secondary | ICD-10-CM

## 2020-05-01 MED ORDER — PREDNISONE 10 MG PO TABS
20.0000 mg | ORAL_TABLET | Freq: Two times a day (BID) | ORAL | 0 refills | Status: AC
Start: 2020-05-01 — End: 2020-05-06

## 2020-05-01 NOTE — Progress Notes (Signed)
[facility] [place]  PEDIATRIC PULMONOLOGY OUTPATIENT VISIT    Patient Name: Alison Boone  Primary Care Physician: Dr. Lendell Boone, Alison Roots, MD  Referring Physician: Dr. Lendell Boone    Patient Complaint:   Follow-up    History of Present Illness:   Alison Boone is a 20 y.o. 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 She felt well after completing the 2nd portion of the antibiotics. She felt well for 2 weeks after she completed the combination of Meropenum and Cipro. A few days ago she started complaining of chest tightness, and her cough is starting to come back.  She was seen at [facility] this morning and had a CT scan that included the lung views. Her labs were good except for the EBV levels that have increased.     Alison Boone is accompanied by mother but they both provided history.    Allergies:     Allergies   Allergen Reactions   . Pistachio [Tree Nuts] Rash       Medication:     Current Outpatient Medications   Medication Sig Dispense Refill   . albuterol-ipratropium (DUO-NEB) 2.5-0.5(3) mg/3 mL nebulizer Take 3 mLs by nebulization 2 (two) times daily as needed (as needed per sick plan) 90 mL 5   . cetirizine (ZYRTEC) 5 MG tablet Take 10 mg by mouth daily        . dornase alpha (PULMOZYME) 1 MG/ML nebulizer solution Inhale 2.5 mg into the lungs.     . insulin glargine (LANTUS) 100 UNIT/ML injection Inject 7 Units into the skin daily.     . Multiple Vitamins-Minerals (AQUADEKS PO) Take 2 tablets by mouth.     Alison Boone VERIO test strip TEST 6 TIMES PER DAY  6   . pancrelipase, Lip-Prot-Amyl, (CREON) 24000-76000 units Cap DR Particles Take 2 capsules of 24,000 units of lipase by mouth with meals and snacks. 300 capsule 5   . Alison Boone (PROTONIX) 40 MG tablet Take 40 mg by mouth daily     . sodium chloride 7 % Nebu Soln Take 4 mLs by nebulization 2 (two) times daily. 240 mL 5   . vitamin D (CHOLECALCIFEROL) 400 UNIT tablet Take 400 Units by mouth daily.     . insulin aspart (NOVOLOG) 100 UNIT/ML  injection Inject into the skin 3 (three) times daily before meals.     Marland Kitchen levoFLOXacin (LEVAQUIN) 500 MG/100ML Solution Administer 500 mg every 24 hours for 14 days 1400 mL 0   . spironolactone (ALDACTONE) 100 MG tablet Take 50 mg by mouth 2 (two) times daily        . vitamin E (vitamin E) 1000 UNIT capsule Take 1,000 Units by mouth daily       No current facility-administered medications for this visit.       Past Medical History:     Past Medical History:   Diagnosis Date   . Cystic fibrosis    . Disease of lung    . Disorder of liver        Past Medical History personally reviewed  Past Surgical History:     Past Surgical History:   Procedure Laterality Date   . ORIF DISTAL RADIUS FRACTURE     . ORIF, FOREARM Right 10/10/2013    Procedure: ORIF, FOREARM;  Surgeon: Alison Deutscher, MD;  Location: [facility] OR;  Service: Orthopedics;  Laterality: Right;  RIGHT FOREARM OPEN REDUCTION INTERNAL FIXATION  W/ SYNTHES ELASTIC NAIL   . REMOVAL HARDWARE UPPER  EXTREMITY MINOR LESS THAN 1 HOUR Right 12/12/2013    Procedure: REMOVAL HARDWARE UPPER EXTREMITY MINOR LESS THAN 1 HOUR;  Surgeon: Alison Deutscher, MD;  Location: [facility] OR;  Service: Orthopedics;  Laterality: Right;  RIGHT RADIUS ROD REMOVAL       Family History:   No family history on file.  Family history personally reviewed  Social History:     Social History     Socioeconomic History   . Marital status: Single   Tobacco Use   . Smoking status: Never Smoker     Social history reviewed and updated  Review of Systems:     On review of systems there were no other pulmonary symptoms. Her sleep pattern was normal. Her appetite was improved with normal bowel movements. There was improvement in her reflux and she is well controlled on Alison Boone and Alison Boone. she will be tested for Alison Boone. Growth and development were normal. There were no complaints referable to her cardiovascular, genitourinary, neurological or endocrine systems other than CFRD. Her blood sugar  has been under fair control recently with values of 120 - 200. She is S/P liver transplant and continues to follow up with the transplant Boone. 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  Her anti-viral regimen will be changed.     Physical Exam:     Vitals:    05/01/20 1502   SpO2: 100%   Weight: 49.5 kg (109 lb 2 oz)   Height: 1.596 m (5' 2.84")       Physical Exam   General: Thin but well appearing.   HEENT: Head: normocephalic  Ears: tympanic membranes normal bilaterally  Significant for: Nasal turbinates: boggy  Hard palate: high  Soft palate: Long  Mallampati class: 3  Tonsils: not seen, tongue coated  Neck: supple, no palpable cervical lymphadenopathy  Chest: symmetric with an increased AP diameter and no accessory muscle use. Equal and good aeration bilaterally with rare crackles over the left base and mid-axillary region.   Cardiac: S1 and S2 with no murmurs  Abdomen: flat, soft, non-tender, no palpable masses. Large abdominal scar.  Extremities: 1+ digital clubbing  Musculoskeletal: back midline  Neurological: grossly non-focal  Psychiatric: normal affect  Skin: clear and dry     Labs:        Rads:          Spirometry:    Moderately severe lower airway obstruction with worsening c/w prior study.     Assessment and Recommendations:   Alison Boone is a 20 y.o. 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   From a nutritional status Alison Boone is stable  From a pulmonary standpoint her lung function is slightly worse than when she discontinued the antibiotic    Rec. that she receive a 3 day course of steroids - will discuss with Alison Boone and Alison Boone    Mother will be e-mailed    Rec. that we cycle her continuously on inhaled antibiotics and consider oral cycling as well. Any exacerbations should be treated promptly.     Plan:  Check culture   Continue nebs and chest PT 2 times/day  Continue rest of CF care plan without change including  Trikafta  Cycle Cayston for 4 weeks then TOBI Podhaler for 4 weeks then repeat the cycle    FUP: 1-2 months  Earlean Shawl, MD, MD  Pediatric Pulmonology Physician  [facility]  Dept: [phone]    05/01/2020 3:35 PM

## 2020-05-04 ENCOUNTER — Encounter (INDEPENDENT_AMBULATORY_CARE_PROVIDER_SITE_OTHER): Payer: Self-pay | Admitting: Pediatric Pulmonology

## 2020-05-07 DIAGNOSIS — Z944 Liver transplant status: Secondary | ICD-10-CM | POA: Insufficient documentation

## 2020-05-07 DIAGNOSIS — J151 Pneumonia due to Pseudomonas: Secondary | ICD-10-CM | POA: Insufficient documentation

## 2020-06-11 ENCOUNTER — Other Ambulatory Visit (FREE_STANDING_LABORATORY_FACILITY): Payer: No Typology Code available for payment source

## 2020-06-11 DIAGNOSIS — Z944 Liver transplant status: Secondary | ICD-10-CM

## 2020-06-11 LAB — CBC AND DIFFERENTIAL
Absolute NRBC: 0 10*3/uL (ref 0.00–0.00)
Basophils Absolute Automated: 0.02 10*3/uL (ref 0.00–0.08)
Basophils Automated: 0.4 %
Eosinophils Absolute Automated: 0.1 10*3/uL (ref 0.00–0.44)
Eosinophils Automated: 2 %
Hematocrit: 38.8 % (ref 34.7–43.7)
Hgb: 12.5 g/dL (ref 11.4–14.8)
Immature Granulocytes Absolute: 0.02 10*3/uL (ref 0.00–0.07)
Immature Granulocytes: 0.4 %
Lymphocytes Absolute Automated: 0.96 10*3/uL (ref 0.42–3.22)
Lymphocytes Automated: 19.5 %
MCH: 25.7 pg (ref 25.1–33.5)
MCHC: 32.2 g/dL (ref 31.5–35.8)
MCV: 79.8 fL (ref 78.0–96.0)
MPV: 9.6 fL (ref 8.9–12.5)
Monocytes Absolute Automated: 0.23 10*3/uL (ref 0.21–0.85)
Monocytes: 4.7 %
Neutrophils Absolute: 3.6 10*3/uL (ref 1.10–6.33)
Neutrophils: 73 %
Nucleated RBC: 0 /100 WBC (ref 0.0–0.0)
Platelets: 150 10*3/uL (ref 142–346)
RBC: 4.86 10*6/uL (ref 3.90–5.10)
RDW: 14 % (ref 11–15)
WBC: 4.93 10*3/uL (ref 3.10–9.50)

## 2020-06-11 LAB — HEMOLYSIS INDEX: Hemolysis Index: 6 (ref 0–24)

## 2020-06-11 LAB — COMPREHENSIVE METABOLIC PANEL
ALT: 13 U/L (ref 0–55)
AST (SGOT): 18 U/L (ref 5–34)
Albumin/Globulin Ratio: 0.9 (ref 0.9–2.2)
Albumin: 3.7 g/dL (ref 3.5–5.0)
Alkaline Phosphatase: 90 U/L (ref 37–117)
Anion Gap: 8 (ref 5.0–15.0)
BUN: 6 mg/dL — ABNORMAL LOW (ref 7.0–19.0)
Bilirubin, Total: 0.5 mg/dL (ref 0.2–1.2)
CO2: 24 mEq/L (ref 21–29)
Calcium: 9.1 mg/dL (ref 8.5–10.5)
Chloride: 106 mEq/L (ref 100–111)
Creatinine: 0.7 mg/dL (ref 0.4–1.5)
Globulin: 3.9 g/dL — ABNORMAL HIGH (ref 2.0–3.7)
Glucose: 143 mg/dL — ABNORMAL HIGH (ref 70–100)
Potassium: 5 mEq/L (ref 3.5–5.1)
Protein, Total: 7.6 g/dL (ref 6.0–8.3)
Sodium: 138 mEq/L (ref 136–145)

## 2020-06-11 LAB — MAGNESIUM: Magnesium: 1.6 mg/dL (ref 1.6–2.6)

## 2020-06-11 LAB — BILIRUBIN, DIRECT: Bilirubin Direct: 0.2 mg/dL (ref 0.0–0.5)

## 2020-06-11 LAB — GFR: EGFR: >60

## 2020-06-11 LAB — GGT: GGT: 10 U/L (ref 10–70)

## 2020-06-11 LAB — TACROLIMUS LEVEL: FK506: 2.9 ng/mL — ABNORMAL LOW (ref 5.0–20.0)

## 2020-06-11 LAB — PHOSPHORUS: Phosphorus: 3.7 mg/dL (ref 2.3–4.7)

## 2020-06-13 LAB — EBV DNA, QUANTITATIVE PCR
Epstein-Barr Virus DNA Quan PCR Log Copies/ML: 3.89 — ABNORMAL HIGH (ref <2.30)
Epstein-Barr virus, DNA, QUAN PCR: 7793 — ABNORMAL HIGH (ref <200)

## 2020-06-13 LAB — CYTOMEGALOVIRUS DNA, QUANTITATIVE REAL-TIME PCR
CMV DNA, QN PCR: NOT DETECTED
CMV DNA,QN Real Time PCR: NOT DETECTED

## 2020-07-03 ENCOUNTER — Ambulatory Visit (INDEPENDENT_AMBULATORY_CARE_PROVIDER_SITE_OTHER): Payer: No Typology Code available for payment source | Admitting: Pediatric Pulmonology

## 2020-07-03 DIAGNOSIS — D849 Immunodeficiency, unspecified: Secondary | ICD-10-CM

## 2020-07-03 DIAGNOSIS — Z794 Long term (current) use of insulin: Secondary | ICD-10-CM

## 2020-07-03 DIAGNOSIS — Z944 Liver transplant status: Secondary | ICD-10-CM

## 2020-07-03 DIAGNOSIS — J151 Pneumonia due to Pseudomonas: Secondary | ICD-10-CM

## 2020-07-03 DIAGNOSIS — E0800 Diabetes mellitus due to underlying condition with hyperosmolarity without nonketotic hyperglycemic-hyperosmolar coma (NKHHC): Secondary | ICD-10-CM

## 2020-07-03 DIAGNOSIS — K8689 Other specified diseases of pancreas: Secondary | ICD-10-CM

## 2020-07-03 MED ORDER — SODIUM CHLORIDE 7 % IN NEBU
4.0000 mL | INHALATION_SOLUTION | Freq: Two times a day (BID) | RESPIRATORY_TRACT | 5 refills | Status: DC
Start: 2020-07-03 — End: 2021-04-05

## 2020-07-03 MED ORDER — FLUTICASONE-SALMETEROL 250-50 MCG/DOSE IN AEPB
1.0000 | INHALATION_SPRAY | Freq: Two times a day (BID) | RESPIRATORY_TRACT | 5 refills | Status: AC
Start: 2020-07-03 — End: ?

## 2020-07-03 NOTE — Progress Notes (Signed)
[facility] [place]  PEDIATRIC PULMONOLOGY OUTPATIENT VISIT    Patient Name: Alison Boone  Primary Care Physician: Dr. Lendell Caprice, Suzi Roots, MD  Referring Physician: Dr. Bonnetta Barry ref. provider found    Patient Complaint:   No chief complaint on file.    History of Present Illness:   Alison Boone is a 20 y.o. 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 She feels better but is not back to baseline yet. She has completed 4 weeks of the combination of Meropenum and Levaquin. Mild chest tightness on and off, and Alison Boone cough has not resolved entirely.  It is more noticeable in the morning and by the afternoon it has resolved. It is usually productive in the morning.  The EBV levels have decreased and is in the 7,000 range now.     Alison Boone is accompanied by mother but both provided history.    Allergies:     Allergies   Allergen Reactions   . Pistachio [Tree Nuts] Rash       Medication:     Current Outpatient Medications   Medication Sig Dispense Refill   . albuterol-ipratropium (DUO-NEB) 2.5-0.5(3) mg/3 mL nebulizer Take 3 mLs by nebulization 2 (two) times daily as needed (as needed per sick plan) 90 mL 5   . cetirizine (ZYRTEC) 5 MG tablet Take 10 mg by mouth daily        . dornase alpha (PULMOZYME) 1 MG/ML nebulizer solution Inhale 2.5 mg into the lungs.     . insulin aspart (NOVOLOG) 100 UNIT/ML injection Inject into the skin 3 (three) times daily before meals.     . insulin glargine (LANTUS) 100 UNIT/ML injection Inject 7 Units into the skin daily.     Marland Kitchen levoFLOXacin (LEVAQUIN) 500 MG/100ML Solution Administer 500 mg every 24 hours for 14 days 1400 mL 0   . Multiple Vitamins-Minerals (AQUADEKS PO) Take 2 tablets by mouth.     Alison Boone VERIO test strip TEST 6 TIMES PER DAY  6   . pancrelipase, Lip-Prot-Amyl, (CREON) 24000-76000 units Cap DR Particles Take 2 capsules of 24,000 units of lipase by mouth with meals and snacks. 300 capsule 5   . pantoprazole (PROTONIX) 40 MG  tablet Take 40 mg by mouth daily     . sodium chloride 7 % Nebu Soln Take 4 mLs by nebulization 2 (two) times daily. 240 mL 5   . spironolactone (ALDACTONE) 100 MG tablet Take 50 mg by mouth 2 (two) times daily        . vitamin D (CHOLECALCIFEROL) 400 UNIT tablet Take 400 Units by mouth daily.       No current facility-administered medications for this visit.       Past Medical History:     Past Medical History:   Diagnosis Date   . Cystic fibrosis    . Disease of lung    . Disorder of liver        Past Medical History personally reviewed  Past Surgical History:     Past Surgical History:   Procedure Laterality Date   . ORIF DISTAL RADIUS FRACTURE     . ORIF, FOREARM Right 10/10/2013    Procedure: ORIF, FOREARM;  Surgeon: Briscoe Deutscher, MD;  Location: [facility] OR;  Service: Orthopedics;  Laterality: Right;  RIGHT FOREARM OPEN REDUCTION INTERNAL FIXATION  W/ SYNTHES ELASTIC NAIL   . REMOVAL HARDWARE UPPER EXTREMITY MINOR LESS THAN 1 HOUR Right 12/12/2013    Procedure:  REMOVAL HARDWARE UPPER EXTREMITY MINOR LESS THAN 1 HOUR;  Surgeon: Briscoe Deutscher, MD;  Location: [facility];  Service: Orthopedics;  Laterality: Right;  RIGHT RADIUS ROD REMOVAL       Family History:   No family history on file.  Family history personally reviewed  Social History:     Social History     Socioeconomic History   . Marital status: Single   Tobacco Use   . Smoking status: Never Smoker     Social history reviewed and updated  Review of Systems:     On review of systems there were no other pulmonary symptoms. Alison Boone sleep pattern was normal. Alison Boone appetite was improved with normal bowel movements. There was improvement in Alison Boone reflux and she is well controlled on Pepcid and Pantoprazole.  Growth and development were normal. There were no complaints referable to Alison Boone cardiovascular, genitourinary, neurological or endocrine systems other than CFRD. Alison Boone blood sugar has been under fair control recently with values of 120 - 200. She is S/P  liver transplant and continues to follow up with the transplant team. 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     Physical Exam:   There were no vitals filed for this visit.    Physical Exam  General: Thin but well appearing.   HEENT: Head: normocephalic  Ears: tympanic membranes normal bilaterally  Significant for: Nasal turbinates: boggy  Hard palate: high  Soft palate: Long  Mallampati class: 3  Tonsils: not seen, tongue coated  Neck: supple, no palpable cervical lymphadenopathy  Chest: symmetric with an increased AP diameter and no accessory muscle use. Equal and good aeration bilaterally with rare crackles over the left base and mid-axillary region.   Cardiac: S1 and S2 with no murmurs  Abdomen: flat, soft, non-tender, no palpable masses. Large abdominal scar.  Extremities: 1+ digital clubbing  Musculoskeletal: back midline  Neurological: grossly non-focal  Psychiatric: normal affect  Skin: clear and dry     Labs:        Rads:        Spirometry:    Severe lower airway obstruction with no significant change c/w prior study.     Assessment and Recommendations:   Alison Boone is a 20 y.o. 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   From a nutritional status Alison Boone is stable  From a pulmonary standpoint Alison Boone lung function has not improved despite 4 weeks of IV antibiotic    - will start Advair diskus 250/50 - 1 inhalation twice a day  - will discuss with Dr. Brooke Dare and [facility] team    Rec. that she resume inhaled antibiotics and consider oral cycling as well. Any exacerbations should be treated promptly.   Discussed leaving PICC in place, giving Alison Boone a break and then starting another round of antibiotics after 3-4 weeks.     Plan:  Continue nebs and chest PT 2 times/day  Continue rest of CF care plan without change including Trikafta - discussed potentially repeating sweat test  Cycle Cayston for 4 weeks then TOBI Podhaler for 4 weeks then repeat the cycle    FUP: 1-2 months          Earlean Shawl, MD, MD  Pediatric Pulmonology Physician  [facility]  Dept: [phone]    07/03/2020 10:23 AM

## 2020-07-06 ENCOUNTER — Encounter (INDEPENDENT_AMBULATORY_CARE_PROVIDER_SITE_OTHER): Payer: Self-pay

## 2020-07-31 ENCOUNTER — Other Ambulatory Visit (FREE_STANDING_LABORATORY_FACILITY): Payer: No Typology Code available for payment source

## 2020-07-31 DIAGNOSIS — Z944 Liver transplant status: Secondary | ICD-10-CM

## 2020-07-31 LAB — CBC AND DIFFERENTIAL
Absolute NRBC: 0 10*3/uL (ref 0.00–0.00)
Basophils Absolute Automated: 0.02 10*3/uL (ref 0.00–0.08)
Basophils Automated: 0.4 %
Eosinophils Absolute Automated: 0.15 10*3/uL (ref 0.00–0.44)
Eosinophils Automated: 2.9 %
Hematocrit: 40.4 % (ref 34.7–43.7)
Hgb: 12.9 g/dL (ref 11.4–14.8)
Immature Granulocytes Absolute: 0.02 10*3/uL (ref 0.00–0.07)
Immature Granulocytes: 0.4 %
Lymphocytes Absolute Automated: 1.05 10*3/uL (ref 0.42–3.22)
Lymphocytes Automated: 20 %
MCH: 25.6 pg (ref 25.1–33.5)
MCHC: 31.9 g/dL (ref 31.5–35.8)
MCV: 80.2 fL (ref 78.0–96.0)
MPV: 9.1 fL (ref 8.9–12.5)
Monocytes Absolute Automated: 0.24 10*3/uL (ref 0.21–0.85)
Monocytes: 4.6 %
Neutrophils Absolute: 3.77 10*3/uL (ref 1.10–6.33)
Neutrophils: 71.7 %
Nucleated RBC: 0 /100 WBC (ref 0.0–0.0)
Platelets: 112 10*3/uL — ABNORMAL LOW (ref 142–346)
RBC: 5.04 10*6/uL (ref 3.90–5.10)
RDW: 15 % (ref 11–15)
WBC: 5.25 10*3/uL (ref 3.10–9.50)

## 2020-07-31 LAB — COMPREHENSIVE METABOLIC PANEL
ALT: 15 U/L (ref 0–55)
AST (SGOT): 20 U/L (ref 5–34)
Albumin/Globulin Ratio: 1.1 (ref 0.9–2.2)
Albumin: 4.1 g/dL (ref 3.5–5.0)
Alkaline Phosphatase: 99 U/L (ref 37–117)
Anion Gap: 6 (ref 5.0–15.0)
BUN: 7 mg/dL (ref 7.0–19.0)
Bilirubin, Total: 0.6 mg/dL (ref 0.2–1.2)
CO2: 26 mEq/L (ref 21–29)
Calcium: 9.2 mg/dL (ref 8.5–10.5)
Chloride: 107 mEq/L (ref 100–111)
Creatinine: 0.8 mg/dL (ref 0.4–1.5)
Globulin: 3.7 g/dL — ABNORMAL HIGH (ref 2.0–3.6)
Glucose: 125 mg/dL — ABNORMAL HIGH (ref 70–100)
Potassium: 5.3 mEq/L — ABNORMAL HIGH (ref 3.5–5.1)
Protein, Total: 7.8 g/dL (ref 6.0–8.3)
Sodium: 139 mEq/L (ref 136–145)

## 2020-07-31 LAB — BILIRUBIN, DIRECT: Bilirubin Direct: 0.3 mg/dL (ref 0.0–0.5)

## 2020-07-31 LAB — TACROLIMUS LEVEL: FK506: 5.1 ng/mL (ref 5.0–20.0)

## 2020-07-31 LAB — MAGNESIUM: Magnesium: 1.8 mg/dL (ref 1.6–2.6)

## 2020-07-31 LAB — HEMOLYSIS INDEX: Hemolysis Index: 2 (ref 0–24)

## 2020-07-31 LAB — PHOSPHORUS: Phosphorus: 3.5 mg/dL (ref 2.3–4.7)

## 2020-07-31 LAB — GGT: GGT: 11 U/L (ref 10–70)

## 2020-07-31 LAB — GFR: EGFR: >60

## 2020-08-01 LAB — CYTOMEGALOVIRUS DNA, QUANTITATIVE REAL-TIME PCR
CMV DNA, QN PCR: NOT DETECTED
CMV DNA,QN Real Time PCR: NOT DETECTED

## 2020-08-01 LAB — EBV DNA, QUANTITATIVE PCR
Epstein-Barr Virus DNA Quan PCR Log Copies/ML: 4.34 — ABNORMAL HIGH
Epstein-Barr virus, DNA, QUAN PCR: 21977 — ABNORMAL HIGH

## 2020-08-05 ENCOUNTER — Other Ambulatory Visit (FREE_STANDING_LABORATORY_FACILITY): Payer: No Typology Code available for payment source

## 2020-08-05 DIAGNOSIS — Z944 Liver transplant status: Secondary | ICD-10-CM

## 2020-08-05 LAB — CBC AND DIFFERENTIAL
Absolute NRBC: 0 10*3/uL (ref 0.00–0.00)
Basophils Absolute Automated: 0.02 10*3/uL (ref 0.00–0.08)
Basophils Automated: 0.5 %
Eosinophils Absolute Automated: 0.09 10*3/uL (ref 0.00–0.44)
Eosinophils Automated: 2.1 %
Hematocrit: 39.7 % (ref 34.7–43.7)
Hgb: 12.4 g/dL (ref 11.4–14.8)
Immature Granulocytes Absolute: 0.01 10*3/uL (ref 0.00–0.07)
Immature Granulocytes: 0.2 %
Lymphocytes Absolute Automated: 0.9 10*3/uL (ref 0.42–3.22)
Lymphocytes Automated: 21 %
MCH: 25.2 pg (ref 25.1–33.5)
MCHC: 31.2 g/dL — ABNORMAL LOW (ref 31.5–35.8)
MCV: 80.7 fL (ref 78.0–96.0)
MPV: 9.5 fL (ref 8.9–12.5)
Monocytes Absolute Automated: 0.2 10*3/uL — ABNORMAL LOW (ref 0.21–0.85)
Monocytes: 4.7 %
Neutrophils Absolute: 3.07 10*3/uL (ref 1.10–6.33)
Neutrophils: 71.5 %
Nucleated RBC: 0 /100 WBC (ref 0.0–0.0)
Platelets: 108 10*3/uL — ABNORMAL LOW (ref 142–346)
RBC: 4.92 10*6/uL (ref 3.90–5.10)
RDW: 15 % (ref 11–15)
WBC: 4.29 10*3/uL (ref 3.10–9.50)

## 2020-08-05 LAB — COMPREHENSIVE METABOLIC PANEL
ALT: 16 U/L (ref 0–55)
AST (SGOT): 22 U/L (ref 5–34)
Albumin/Globulin Ratio: 1.1 (ref 0.9–2.2)
Albumin: 4.1 g/dL (ref 3.5–5.0)
Alkaline Phosphatase: 92 U/L (ref 37–117)
Anion Gap: 6 (ref 5.0–15.0)
BUN: 5 mg/dL — ABNORMAL LOW (ref 7.0–19.0)
Bilirubin, Total: 0.6 mg/dL (ref 0.2–1.2)
CO2: 25 mEq/L (ref 21–29)
Calcium: 9.5 mg/dL (ref 8.5–10.5)
Chloride: 105 mEq/L (ref 100–111)
Creatinine: 0.7 mg/dL (ref 0.4–1.5)
Globulin: 3.7 g/dL — ABNORMAL HIGH (ref 2.0–3.6)
Glucose: 105 mg/dL — ABNORMAL HIGH (ref 70–100)
Potassium: 4.9 mEq/L (ref 3.5–5.1)
Protein, Total: 7.8 g/dL (ref 6.0–8.3)
Sodium: 136 mEq/L (ref 136–145)

## 2020-08-05 LAB — TACROLIMUS LEVEL: FK506: 4.9 ng/mL — ABNORMAL LOW (ref 5.0–20.0)

## 2020-08-05 LAB — PHOSPHORUS: Phosphorus: 3.5 mg/dL (ref 2.3–4.7)

## 2020-08-05 LAB — BILIRUBIN, DIRECT: Bilirubin Direct: 0.3 mg/dL (ref 0.0–0.5)

## 2020-08-05 LAB — MAGNESIUM: Magnesium: 1.6 mg/dL (ref 1.6–2.6)

## 2020-08-05 LAB — HEMOLYSIS INDEX: Hemolysis Index: 16 (ref 0–24)

## 2020-08-05 LAB — GGT: GGT: 11 U/L (ref 10–70)

## 2020-08-05 LAB — GFR: EGFR: >60

## 2020-08-07 LAB — EBV DNA, QUANTITATIVE PCR
Epstein-Barr Virus DNA Quan PCR Log Copies/ML: 4.04 — ABNORMAL HIGH
Epstein-Barr virus, DNA, QUAN PCR: 11010 — ABNORMAL HIGH

## 2020-08-07 LAB — CYTOMEGALOVIRUS DNA, QUANTITATIVE REAL-TIME PCR
CMV DNA, QN PCR: NOT DETECTED
CMV DNA,QN Real Time PCR: NOT DETECTED

## 2020-09-16 ENCOUNTER — Other Ambulatory Visit (FREE_STANDING_LABORATORY_FACILITY): Payer: No Typology Code available for payment source

## 2020-09-16 DIAGNOSIS — Z944 Liver transplant status: Secondary | ICD-10-CM

## 2020-09-16 LAB — CBC AND DIFFERENTIAL
Absolute NRBC: 0 10*3/uL (ref 0.00–0.00)
Basophils Absolute Automated: 0.01 10*3/uL (ref 0.00–0.08)
Basophils Automated: 0.3 %
Eosinophils Absolute Automated: 0.16 10*3/uL (ref 0.00–0.44)
Eosinophils Automated: 4.7 %
Hematocrit: 38.1 % (ref 34.7–43.7)
Hgb: 12.3 g/dL (ref 11.4–14.8)
Immature Granulocytes Absolute: 0.01 10*3/uL (ref 0.00–0.07)
Immature Granulocytes: 0.3 %
Lymphocytes Absolute Automated: 1.1 10*3/uL (ref 0.42–3.22)
Lymphocytes Automated: 32 %
MCH: 26.1 pg (ref 25.1–33.5)
MCHC: 32.3 g/dL (ref 31.5–35.8)
MCV: 80.9 fL (ref 78.0–96.0)
MPV: 9.7 fL (ref 8.9–12.5)
Monocytes Absolute Automated: 0.22 10*3/uL (ref 0.21–0.85)
Monocytes: 6.4 %
Neutrophils Absolute: 1.94 10*3/uL (ref 1.10–6.33)
Neutrophils: 56.3 %
Nucleated RBC: 0 /100 WBC (ref 0.0–0.0)
Platelets: 95 10*3/uL — ABNORMAL LOW (ref 142–346)
RBC: 4.71 10*6/uL (ref 3.90–5.10)
RDW: 15 % (ref 11–15)
WBC: 3.44 10*3/uL (ref 3.10–9.50)

## 2020-09-16 LAB — COMPREHENSIVE METABOLIC PANEL
ALT: 19 U/L (ref 0–55)
AST (SGOT): 22 U/L (ref 5–34)
Albumin/Globulin Ratio: 1.1 (ref 0.9–2.2)
Albumin: 3.9 g/dL (ref 3.5–5.0)
Alkaline Phosphatase: 85 U/L (ref 37–117)
Anion Gap: 11 (ref 5.0–15.0)
BUN: 7 mg/dL (ref 7.0–19.0)
Bilirubin, Total: 0.4 mg/dL (ref 0.2–1.2)
CO2: 18 mEq/L — ABNORMAL LOW (ref 21–29)
Calcium: 8.7 mg/dL (ref 8.5–10.5)
Chloride: 106 mEq/L (ref 100–111)
Creatinine: 0.8 mg/dL (ref 0.4–1.5)
Globulin: 3.5 g/dL (ref 2.0–3.6)
Glucose: 136 mg/dL — ABNORMAL HIGH (ref 70–100)
Potassium: 4.6 mEq/L (ref 3.5–5.1)
Protein, Total: 7.4 g/dL (ref 6.0–8.3)
Sodium: 135 mEq/L — ABNORMAL LOW (ref 136–145)

## 2020-09-16 LAB — GGT: GGT: 9 U/L — ABNORMAL LOW (ref 10–70)

## 2020-09-16 LAB — BILIRUBIN, DIRECT: Bilirubin Direct: 0.2 mg/dL (ref 0.0–0.5)

## 2020-09-16 LAB — GFR: EGFR: >60

## 2020-09-16 LAB — TACROLIMUS LEVEL: FK506: 3.5 ng/mL — ABNORMAL LOW (ref 5.0–20.0)

## 2020-09-16 LAB — HEMOLYSIS INDEX: Hemolysis Index: 15 Index (ref 0–24)

## 2020-09-16 LAB — MAGNESIUM: Magnesium: 1.6 mg/dL (ref 1.6–2.6)

## 2020-09-16 LAB — PHOSPHORUS: Phosphorus: 3.4 mg/dL (ref 2.3–4.7)

## 2020-09-17 LAB — EBV DNA, QUANTITATIVE PCR
Epstein-Barr Virus DNA Quan PCR Log Copies/ML: 3.53 Log cps/mL — ABNORMAL HIGH
Epstein-Barr virus, DNA, QUAN PCR: 3400 copies/mL — ABNORMAL HIGH

## 2020-09-17 LAB — CYTOMEGALOVIRUS DNA, QUANTITATIVE REAL-TIME PCR
CMV DNA, QN PCR: NOT DETECTED log IU/mL
CMV DNA,QN Real Time PCR: NOT DETECTED IU/mL

## 2020-09-25 ENCOUNTER — Encounter (INDEPENDENT_AMBULATORY_CARE_PROVIDER_SITE_OTHER): Payer: Self-pay | Admitting: Pediatric Pulmonology

## 2020-09-30 ENCOUNTER — Other Ambulatory Visit (FREE_STANDING_LABORATORY_FACILITY): Payer: No Typology Code available for payment source

## 2020-09-30 DIAGNOSIS — Z944 Liver transplant status: Secondary | ICD-10-CM

## 2020-09-30 LAB — COMPREHENSIVE METABOLIC PANEL
ALT: 24 U/L (ref 0–55)
AST (SGOT): 31 U/L (ref 5–34)
Albumin/Globulin Ratio: 1.2 (ref 0.9–2.2)
Albumin: 4 g/dL (ref 3.5–5.0)
Alkaline Phosphatase: 86 U/L (ref 37–117)
Anion Gap: 7 (ref 5.0–15.0)
BUN: 6 mg/dL — ABNORMAL LOW (ref 7.0–19.0)
Bilirubin, Total: 0.5 mg/dL (ref 0.2–1.2)
CO2: 25 mEq/L (ref 21–29)
Calcium: 8.9 mg/dL (ref 8.5–10.5)
Chloride: 108 mEq/L (ref 100–111)
Creatinine: 0.9 mg/dL (ref 0.4–1.5)
Globulin: 3.3 g/dL (ref 2.0–3.6)
Glucose: 86 mg/dL (ref 70–100)
Potassium: 4.9 mEq/L (ref 3.5–5.1)
Protein, Total: 7.3 g/dL (ref 6.0–8.3)
Sodium: 140 mEq/L (ref 136–145)

## 2020-09-30 LAB — CBC AND DIFFERENTIAL
Absolute NRBC: 0 10*3/uL (ref 0.00–0.00)
Basophils Absolute Automated: 0.01 10*3/uL (ref 0.00–0.08)
Basophils Automated: 0.3 %
Eosinophils Absolute Automated: 0.14 10*3/uL (ref 0.00–0.44)
Eosinophils Automated: 4 %
Hematocrit: 40.3 % (ref 34.7–43.7)
Hgb: 13 g/dL (ref 11.4–14.8)
Immature Granulocytes Absolute: 0.01 10*3/uL (ref 0.00–0.07)
Immature Granulocytes: 0.3 %
Lymphocytes Absolute Automated: 1.16 10*3/uL (ref 0.42–3.22)
Lymphocytes Automated: 33.4 %
MCH: 25.9 pg (ref 25.1–33.5)
MCHC: 32.3 g/dL (ref 31.5–35.8)
MCV: 80.3 fL (ref 78.0–96.0)
MPV: 9.8 fL (ref 8.9–12.5)
Monocytes Absolute Automated: 0.22 10*3/uL (ref 0.21–0.85)
Monocytes: 6.3 %
Neutrophils Absolute: 1.93 10*3/uL (ref 1.10–6.33)
Neutrophils: 55.7 %
Nucleated RBC: 0 /100 WBC (ref 0.0–0.0)
Platelets: 88 10*3/uL — ABNORMAL LOW (ref 142–346)
RBC: 5.02 10*6/uL (ref 3.90–5.10)
RDW: 14 % (ref 11–15)
WBC: 3.47 10*3/uL (ref 3.10–9.50)

## 2020-09-30 LAB — HEMOLYSIS INDEX: Hemolysis Index: 18 Index (ref 0–24)

## 2020-09-30 LAB — TACROLIMUS LEVEL: FK506: 3.9 ng/mL — ABNORMAL LOW (ref 5.0–20.0)

## 2020-09-30 LAB — MAGNESIUM: Magnesium: 1.7 mg/dL (ref 1.6–2.6)

## 2020-09-30 LAB — PHOSPHORUS: Phosphorus: 3.5 mg/dL (ref 2.3–4.7)

## 2020-09-30 LAB — BILIRUBIN, DIRECT: Bilirubin Direct: 0.2 mg/dL (ref 0.0–0.5)

## 2020-09-30 LAB — GGT: GGT: 10 U/L (ref 10–70)

## 2020-09-30 LAB — GFR: EGFR: >60

## 2020-10-01 LAB — CYTOMEGALOVIRUS DNA, QUANTITATIVE REAL-TIME PCR
CMV DNA, QN PCR: NOT DETECTED log IU/mL
CMV DNA,QN Real Time PCR: NOT DETECTED IU/mL

## 2020-10-01 LAB — EBV DNA, QUANTITATIVE PCR
Epstein-Barr Virus DNA Quan PCR Log Copies/ML: 3.23 Log cps/mL — ABNORMAL HIGH
Epstein-Barr virus, DNA, QUAN PCR: 1703 copies/mL — ABNORMAL HIGH

## 2020-10-16 ENCOUNTER — Other Ambulatory Visit (FREE_STANDING_LABORATORY_FACILITY): Payer: No Typology Code available for payment source

## 2020-10-16 DIAGNOSIS — Z944 Liver transplant status: Secondary | ICD-10-CM

## 2020-10-16 LAB — CBC AND DIFFERENTIAL
Absolute NRBC: 0 10*3/uL (ref 0.00–0.00)
Basophils Absolute Automated: 0.02 10*3/uL (ref 0.00–0.08)
Basophils Automated: 0.6 %
Eosinophils Absolute Automated: 0.09 10*3/uL (ref 0.00–0.44)
Eosinophils Automated: 2.9 %
Hematocrit: 39 % (ref 34.7–43.7)
Hgb: 12.6 g/dL (ref 11.4–14.8)
Immature Granulocytes Absolute: 0.02 10*3/uL (ref 0.00–0.07)
Immature Granulocytes: 0.6 %
Lymphocytes Absolute Automated: 0.96 10*3/uL (ref 0.42–3.22)
Lymphocytes Automated: 31.2 %
MCH: 25.8 pg (ref 25.1–33.5)
MCHC: 32.3 g/dL (ref 31.5–35.8)
MCV: 79.9 fL (ref 78.0–96.0)
MPV: 10.5 fL (ref 8.9–12.5)
Monocytes Absolute Automated: 0.19 10*3/uL — ABNORMAL LOW (ref 0.21–0.85)
Monocytes: 6.2 %
Neutrophils Absolute: 1.8 10*3/uL (ref 1.10–6.33)
Neutrophils: 58.5 %
Nucleated RBC: 0 /100 WBC (ref 0.0–0.0)
Platelets: 92 10*3/uL — ABNORMAL LOW (ref 142–346)
RBC: 4.88 10*6/uL (ref 3.90–5.10)
RDW: 14 % (ref 11–15)
WBC: 3.08 10*3/uL — ABNORMAL LOW (ref 3.10–9.50)

## 2020-10-16 LAB — HEMOLYSIS INDEX: Hemolysis Index: 2 Index (ref 0–24)

## 2020-10-16 LAB — GGT: GGT: 12 U/L (ref 10–70)

## 2020-10-16 LAB — COMPREHENSIVE METABOLIC PANEL
ALT: 22 U/L (ref 0–55)
AST (SGOT): 23 U/L (ref 5–34)
Albumin/Globulin Ratio: 1.2 (ref 0.9–2.2)
Albumin: 3.9 g/dL (ref 3.5–5.0)
Alkaline Phosphatase: 84 U/L (ref 37–117)
Anion Gap: 10 (ref 5.0–15.0)
BUN: 6 mg/dL — ABNORMAL LOW (ref 7.0–19.0)
Bilirubin, Total: 0.5 mg/dL (ref 0.2–1.2)
CO2: 23 mEq/L (ref 21–29)
Calcium: 9 mg/dL (ref 8.5–10.5)
Chloride: 106 mEq/L (ref 100–111)
Creatinine: 0.8 mg/dL (ref 0.4–1.5)
Globulin: 3.3 g/dL (ref 2.0–3.6)
Glucose: 112 mg/dL — ABNORMAL HIGH (ref 70–100)
Potassium: 4.6 mEq/L (ref 3.5–5.1)
Protein, Total: 7.2 g/dL (ref 6.0–8.3)
Sodium: 139 mEq/L (ref 136–145)

## 2020-10-16 LAB — GFR: EGFR: >60

## 2020-10-16 LAB — BILIRUBIN, DIRECT: Bilirubin Direct: 0.2 mg/dL (ref 0.0–0.5)

## 2020-10-16 LAB — PHOSPHORUS: Phosphorus: 3.9 mg/dL (ref 2.3–4.7)

## 2020-10-16 LAB — TACROLIMUS LEVEL: FK506: 3 ng/mL — ABNORMAL LOW (ref 5.0–20.0)

## 2020-10-16 LAB — MAGNESIUM: Magnesium: 1.7 mg/dL (ref 1.6–2.6)

## 2020-10-18 LAB — EBV DNA, QUANTITATIVE PCR
Epstein-Barr Virus DNA Quan PCR Log Copies/ML: 4.28 Log cps/mL — ABNORMAL HIGH
Epstein-Barr virus, DNA, QUAN PCR: 19140 copies/mL — ABNORMAL HIGH

## 2020-10-18 LAB — CYTOMEGALOVIRUS DNA, QUANTITATIVE REAL-TIME PCR
CMV DNA, QN PCR: NOT DETECTED log IU/mL
CMV DNA,QN Real Time PCR: NOT DETECTED IU/mL

## 2020-10-30 ENCOUNTER — Other Ambulatory Visit (FREE_STANDING_LABORATORY_FACILITY): Payer: No Typology Code available for payment source

## 2020-10-30 DIAGNOSIS — Z944 Liver transplant status: Secondary | ICD-10-CM

## 2020-10-30 LAB — COMPREHENSIVE METABOLIC PANEL
ALT: 19 U/L (ref 0–55)
AST (SGOT): 23 U/L (ref 5–34)
Albumin/Globulin Ratio: 1.1 (ref 0.9–2.2)
Albumin: 3.9 g/dL (ref 3.5–5.0)
Alkaline Phosphatase: 93 U/L (ref 37–117)
Anion Gap: 4 — ABNORMAL LOW (ref 5.0–15.0)
BUN: 5 mg/dL — ABNORMAL LOW (ref 7.0–19.0)
Bilirubin, Total: 0.4 mg/dL (ref 0.2–1.2)
CO2: 24 mEq/L (ref 21–29)
Calcium: 8.9 mg/dL (ref 8.5–10.5)
Chloride: 108 mEq/L (ref 100–111)
Creatinine: 0.8 mg/dL (ref 0.4–1.5)
Globulin: 3.5 g/dL (ref 2.0–3.6)
Glucose: 88 mg/dL (ref 70–100)
Potassium: 5.1 mEq/L (ref 3.5–5.1)
Protein, Total: 7.4 g/dL (ref 6.0–8.3)
Sodium: 136 mEq/L (ref 136–145)

## 2020-10-30 LAB — CBC AND DIFFERENTIAL
Absolute NRBC: 0 10*3/uL (ref 0.00–0.00)
Basophils Absolute Automated: 0.01 10*3/uL (ref 0.00–0.08)
Basophils Automated: 0.3 %
Eosinophils Absolute Automated: 0.17 10*3/uL (ref 0.00–0.44)
Eosinophils Automated: 5.4 %
Hematocrit: 38.9 % (ref 34.7–43.7)
Hgb: 12.3 g/dL (ref 11.4–14.8)
Immature Granulocytes Absolute: 0.01 10*3/uL (ref 0.00–0.07)
Immature Granulocytes: 0.3 %
Lymphocytes Absolute Automated: 1.13 10*3/uL (ref 0.42–3.22)
Lymphocytes Automated: 36 %
MCH: 25.7 pg (ref 25.1–33.5)
MCHC: 31.6 g/dL (ref 31.5–35.8)
MCV: 81.2 fL (ref 78.0–96.0)
MPV: 10 fL (ref 8.9–12.5)
Monocytes Absolute Automated: 0.17 10*3/uL — ABNORMAL LOW (ref 0.21–0.85)
Monocytes: 5.4 %
Neutrophils Absolute: 1.65 10*3/uL (ref 1.10–6.33)
Neutrophils: 52.6 %
Nucleated RBC: 0 /100 WBC (ref 0.0–0.0)
Platelets: 93 10*3/uL — ABNORMAL LOW (ref 142–346)
RBC: 4.79 10*6/uL (ref 3.90–5.10)
RDW: 14 % (ref 11–15)
WBC: 3.14 10*3/uL (ref 3.10–9.50)

## 2020-10-30 LAB — MAGNESIUM: Magnesium: 1.7 mg/dL (ref 1.6–2.6)

## 2020-10-30 LAB — BILIRUBIN, DIRECT: Bilirubin Direct: 0.2 mg/dL (ref 0.0–0.5)

## 2020-10-30 LAB — PHOSPHORUS: Phosphorus: 3.5 mg/dL (ref 2.3–4.7)

## 2020-10-30 LAB — HEMOLYSIS INDEX: Hemolysis Index: 2 Index (ref 0–24)

## 2020-10-30 LAB — GGT: GGT: 12 U/L (ref 10–70)

## 2020-10-30 LAB — GFR: EGFR: >60

## 2020-10-30 LAB — TACROLIMUS LEVEL: FK506: 2.2 ng/mL — ABNORMAL LOW (ref 5.0–20.0)

## 2020-10-31 LAB — CYTOMEGALOVIRUS DNA, QUANTITATIVE REAL-TIME PCR
CMV DNA, QN PCR: NOT DETECTED log IU/mL
CMV DNA,QN Real Time PCR: NOT DETECTED IU/mL

## 2020-11-02 ENCOUNTER — Ambulatory Visit (INDEPENDENT_AMBULATORY_CARE_PROVIDER_SITE_OTHER): Payer: No Typology Code available for payment source | Admitting: Pediatric Pulmonology

## 2020-11-02 VITALS — HR 96 | Ht 62.95 in | Wt 110.2 lb

## 2020-11-02 DIAGNOSIS — E0800 Diabetes mellitus due to underlying condition with hyperosmolarity without nonketotic hyperglycemic-hyperosmolar coma (NKHHC): Secondary | ICD-10-CM

## 2020-11-02 DIAGNOSIS — Z944 Liver transplant status: Secondary | ICD-10-CM

## 2020-11-02 DIAGNOSIS — K8689 Other specified diseases of pancreas: Secondary | ICD-10-CM

## 2020-11-02 DIAGNOSIS — J151 Pneumonia due to Pseudomonas: Secondary | ICD-10-CM

## 2020-11-02 DIAGNOSIS — Z794 Long term (current) use of insulin: Secondary | ICD-10-CM

## 2020-11-02 LAB — EBV DNA, QUANTITATIVE PCR
Epstein-Barr Virus DNA Quan PCR Log Copies/ML: 4.31 Log cps/mL — ABNORMAL HIGH
Epstein-Barr virus, DNA, QUAN PCR: 20510 copies/mL — ABNORMAL HIGH

## 2020-11-02 MED ORDER — ALBUTEROL SULFATE 1.25 MG/3ML IN NEBU
1.0000 | INHALATION_SOLUTION | Freq: Three times a day (TID) | RESPIRATORY_TRACT | 5 refills | Status: AC
Start: 2020-11-02 — End: ?

## 2020-11-02 NOTE — Progress Notes (Signed)
[facility]  PEDIATRIC PULMONOLOGY OUTPATIENT VISIT    Patient Name: Alison Boone  Primary Care Physician: Dr. Lendell Caprice, Suzi Roots, MD  Referring Physician: Dr. Bonnetta Barry ref. provider found    Patient Complaint:   No chief complaint on file.    History of Present Illness:   Alison Boone is a 20 y.o. 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 She feels better and is back to baseline. She coughs sometimes but it is random. It is more noticeable in the morning and by the afternoon it has resolved. It is usually productive in the morning.  The EBV level has increased and is in the 19,000 range now. She has not been using the Advair consistently.      Alison Boone is accompanied by mother but she provided history.    Allergies:     Allergies   Allergen Reactions    Pistachio [Tree Nuts] Rash and Edema     Facial swelling       Medication:     Current Outpatient Medications   Medication Sig Dispense Refill    albuterol-ipratropium (DUO-NEB) 2.5-0.5(3) mg/3 mL nebulizer Take 3 mLs by nebulization 2 (two) times daily as needed (as needed per sick plan) 90 mL 5    cetirizine (ZYRTEC) 5 MG tablet Take 10 mg by mouth daily         dornase alpha (PULMOZYME) 1 MG/ML nebulizer solution Inhale 2.5 mg into the lungs.      Elexacaf-Tezacaf-Ivacaf&Ivacaf (Trikafta) 100-50-75 & 150 MG Tablet Therapy Pack       fluticasone-salmeterol (Advair Diskus) 250-50 MCG/DOSE Aerosol Pwdr, Breath Activated Inhale 1 puff into the lungs 2 (two) times daily 1 each 5    insulin aspart (NOVOLOG) 100 UNIT/ML injection Inject into the skin 3 (three) times daily before meals.      insulin glargine (LANTUS) 100 UNIT/ML injection Inject 7 Units into the skin daily.      levoFLOXacin (LEVAQUIN) 500 MG/100ML Solution Administer 500 mg every 24 hours for 14 days 1400 mL 0    Multiple Vitamins-Minerals (AQUADEKS PO) Take 2 tablets by mouth.      ONETOUCH VERIO test strip TEST 6 TIMES PER DAY  6    pancrelipase,  Lip-Prot-Amyl, (CREON) 24000-76000 units Cap DR Particles Take 2 capsules of 24,000 units of lipase by mouth with meals and snacks. 300 capsule 5    pantoprazole (PROTONIX) 40 MG tablet Take 40 mg by mouth daily      sodium chloride 7 % Nebu Soln Take 4 mLs by nebulization 2 (two) times daily 240 mL 5    tacrolimus (ENVARSUS XR) extended release tablet 3 mg      vitamin D (CHOLECALCIFEROL) 400 UNIT tablet Take 400 Units by mouth daily.       No current facility-administered medications for this visit.       Past Medical History:     Past Medical History:   Diagnosis Date    Cystic fibrosis     Disease of lung     Disorder of liver        Past Medical History personally reviewed  Past Surgical History:     Past Surgical History:   Procedure Laterality Date    ORIF DISTAL RADIUS FRACTURE      ORIF, FOREARM Right 10/10/2013    Procedure: ORIF, FOREARM;  Surgeon: Briscoe Deutscher, MD;  Location: [facility] OR;  Service: Orthopedics;  Laterality: Right;  RIGHT FOREARM  OPEN REDUCTION INTERNAL FIXATION  W/ SYNTHES ELASTIC NAIL    REMOVAL HARDWARE UPPER EXTREMITY MINOR LESS THAN 1 HOUR Right 12/12/2013    Procedure: REMOVAL HARDWARE UPPER EXTREMITY MINOR LESS THAN 1 HOUR;  Surgeon: Briscoe Deutscher, MD;  Location: [facility] OR;  Service: Orthopedics;  Laterality: Right;  RIGHT RADIUS ROD REMOVAL       Family History:   No family history on file.  Family history personally reviewed  Social History:     Social History     Socioeconomic History    Marital status: Single   Tobacco Use    Smoking status: Never     Social history reviewed and updated  Review of Systems:     On review of systems there were no other pulmonary symptoms. Her sleep pattern was normal. Her appetite was improved with normal bowel movements. There was improvement in her reflux and she is well controlled on Nexium.  Growth and development were normal. There were no complaints referable to her cardiovascular, genitourinary, neurological or endocrine  systems other than CFRD. Her blood sugar has been under fair control recently with values of < 200. She is S/P liver transplant and continues to follow up with the transplant team. 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     Physical Exam:   There were no vitals filed for this visit.    Physical Exam  General: Thin but well appearing.   HEENT: Head: normocephalic  Ears: tympanic membranes normal bilaterally  Significant for: Nasal turbinates: boggy  Hard palate: high  Soft palate: Long  Mallampati class: 3  Tonsils: not seen, tongue coated  Neck: supple, no palpable cervical lymphadenopathy  Chest: symmetric with an increased AP diameter and no accessory muscle use. Equal and good aeration bilaterally with rare crackles over the left base and mid-axillary region. They clear after she breathes deeply.   Cardiac: S1 and S2 with no murmurs  Abdomen: flat, soft, non-tender, no palpable masses. Large abdominal scar.  Extremities: 1+ digital clubbing  Musculoskeletal: back midline  Neurological: grossly non-focal  Psychiatric: normal affect  Skin: clear and dry       Labs:        Rads:            Spirometry:    Mild to moderate lower airway obstruction, improved c/w prior study.     Assessment and Recommendations:   Alison Boone is a 20 y.o. 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   From a nutritional status Alison Boone is stable  From a pulmonary standpoint her lung function has improved and is stable.     Hold Advair diskus 250/50 for now.      Rec. that she resume inhaled antibiotics and continue alternating cycling with [place] and TOBI (does not like podhaler).   Any exacerbations should be treated promptly.       Plan:  Continue nebs and chest PT 2 times/day  Continue rest of CF care plan without change including Trikafta - discussed potentially repeating sweat test    FUP: 1-2 months                 Earlean Shawl, MD, MD  Pediatric Pulmonology Physician  [facility]  [place]  Dept: [phone]    11/02/2020 10:28 AM

## 2020-11-05 ENCOUNTER — Encounter (INDEPENDENT_AMBULATORY_CARE_PROVIDER_SITE_OTHER): Payer: Self-pay | Admitting: Pediatric Pulmonology

## 2020-11-10 ENCOUNTER — Telehealth (INDEPENDENT_AMBULATORY_CARE_PROVIDER_SITE_OTHER): Payer: Self-pay

## 2021-01-14 ENCOUNTER — Other Ambulatory Visit (FREE_STANDING_LABORATORY_FACILITY): Payer: No Typology Code available for payment source

## 2021-01-14 DIAGNOSIS — Z944 Liver transplant status: Secondary | ICD-10-CM

## 2021-01-14 LAB — COMPREHENSIVE METABOLIC PANEL
ALT: 27 U/L (ref 0–55)
AST (SGOT): 26 U/L (ref 5–41)
Albumin/Globulin Ratio: 1.1 (ref 0.9–2.2)
Albumin: 3.8 g/dL (ref 3.5–5.0)
Alkaline Phosphatase: 114 U/L (ref 37–117)
Anion Gap: 7 (ref 5.0–15.0)
BUN: 6 mg/dL — ABNORMAL LOW (ref 7.0–21.0)
Bilirubin, Total: 0.5 mg/dL (ref 0.2–1.2)
CO2: 23 mEq/L (ref 17–29)
Calcium: 9.3 mg/dL (ref 8.5–10.5)
Chloride: 104 mEq/L (ref 99–111)
Creatinine: 0.8 mg/dL (ref 0.4–1.0)
Globulin: 3.5 g/dL (ref 2.0–3.6)
Glucose: 119 mg/dL — ABNORMAL HIGH (ref 70–100)
Potassium: 4.4 mEq/L (ref 3.5–5.3)
Protein, Total: 7.3 g/dL (ref 6.0–8.3)
Sodium: 134 mEq/L — ABNORMAL LOW (ref 135–145)

## 2021-01-14 LAB — CBC AND DIFFERENTIAL
Absolute NRBC: 0 10*3/uL (ref 0.00–0.00)
Basophils Absolute Automated: 0.01 10*3/uL (ref 0.00–0.08)
Basophils Automated: 0.3 %
Eosinophils Absolute Automated: 0.1 10*3/uL (ref 0.00–0.44)
Eosinophils Automated: 3 %
Hematocrit: 38.7 % (ref 34.7–43.7)
Hgb: 12.4 g/dL (ref 11.4–14.8)
Immature Granulocytes Absolute: 0 10*3/uL (ref 0.00–0.07)
Immature Granulocytes: 0 %
Lymphocytes Absolute Automated: 0.84 10*3/uL (ref 0.42–3.22)
Lymphocytes Automated: 25.6 %
MCH: 27 pg (ref 25.1–33.5)
MCHC: 32 g/dL (ref 31.5–35.8)
MCV: 84.1 fL (ref 78.0–96.0)
MPV: 10.6 fL (ref 8.9–12.5)
Monocytes Absolute Automated: 0.21 10*3/uL (ref 0.21–0.85)
Monocytes: 6.4 %
Neutrophils Absolute: 2.12 10*3/uL (ref 1.10–6.33)
Neutrophils: 64.7 %
Nucleated RBC: 0 /100 WBC (ref 0.0–0.0)
Platelets: 85 10*3/uL — ABNORMAL LOW (ref 142–346)
RBC: 4.6 10*6/uL (ref 3.90–5.10)
RDW: 15 % (ref 11–15)
WBC: 3.28 10*3/uL (ref 3.10–9.50)

## 2021-01-14 LAB — TACROLIMUS LEVEL: FK506: 2.7 ng/mL — ABNORMAL LOW (ref 5.0–20.0)

## 2021-01-14 LAB — GGT: GGT: 11 U/L (ref 10–70)

## 2021-01-14 LAB — HEMOLYSIS INDEX: Hemolysis Index: 12 Index (ref 0–24)

## 2021-01-14 LAB — BILIRUBIN, DIRECT: Bilirubin Direct: 0.2 mg/dL (ref 0.0–0.5)

## 2021-01-14 LAB — PHOSPHORUS: Phosphorus: 3.4 mg/dL (ref 2.3–4.7)

## 2021-01-14 LAB — MAGNESIUM: Magnesium: 1.6 mg/dL (ref 1.6–2.6)

## 2021-01-14 LAB — GFR: EGFR: >60

## 2021-01-14 LAB — SIROLIMUS LEVEL: Rapamycin: 3.3 ng/mL — ABNORMAL LOW (ref 5.0–15.0)

## 2021-01-15 LAB — CYTOMEGALOVIRUS DNA, QUANTITATIVE REAL-TIME PCR
CMV DNA, QN PCR: <2.3 log IU/mL — ABNORMAL HIGH
CMV DNA,QN Real Time PCR: <200 IU/mL — ABNORMAL HIGH

## 2021-01-16 LAB — EBV DNA, QUANTITATIVE PCR
Epstein-Barr Virus DNA Quan PCR Log Copies/ML: 4.31 Log cps/mL — ABNORMAL HIGH
Epstein-Barr virus, DNA, QUAN PCR: 20510 copies/mL — ABNORMAL HIGH

## 2021-01-18 ENCOUNTER — Ambulatory Visit (INDEPENDENT_AMBULATORY_CARE_PROVIDER_SITE_OTHER): Payer: No Typology Code available for payment source | Admitting: Pediatric Pulmonology

## 2021-01-21 NOTE — Telephone Encounter (Signed)
No answer - nutrition counseling.

## 2021-03-15 ENCOUNTER — Other Ambulatory Visit (INDEPENDENT_AMBULATORY_CARE_PROVIDER_SITE_OTHER): Payer: Self-pay | Admitting: Pediatric Pulmonology

## 2021-03-16 ENCOUNTER — Other Ambulatory Visit (FREE_STANDING_LABORATORY_FACILITY): Payer: No Typology Code available for payment source

## 2021-03-16 DIAGNOSIS — Z944 Liver transplant status: Secondary | ICD-10-CM

## 2021-03-16 LAB — CBC AND DIFFERENTIAL
Absolute NRBC: 0 10*3/uL (ref 0.00–0.00)
Basophils Absolute Automated: 0.01 10*3/uL (ref 0.00–0.08)
Basophils Automated: 0.2 %
Eosinophils Absolute Automated: 0.16 10*3/uL (ref 0.00–0.44)
Eosinophils Automated: 3.3 %
Hematocrit: 36 % (ref 34.7–43.7)
Hgb: 11 g/dL — ABNORMAL LOW (ref 11.4–14.8)
Immature Granulocytes Absolute: 0.02 10*3/uL (ref 0.00–0.07)
Immature Granulocytes: 0.4 %
Lymphocytes Absolute Automated: 0.9 10*3/uL (ref 0.42–3.22)
Lymphocytes Automated: 18.8 %
MCH: 21.8 pg — ABNORMAL LOW (ref 25.1–33.5)
MCHC: 30.6 g/dL — ABNORMAL LOW (ref 31.5–35.8)
MCV: 71.3 fL — ABNORMAL LOW (ref 78.0–96.0)
MPV: 10.3 fL (ref 8.9–12.5)
Monocytes Absolute Automated: 0.3 10*3/uL (ref 0.21–0.85)
Monocytes: 6.3 %
Neutrophils Absolute: 3.39 10*3/uL (ref 1.10–6.33)
Neutrophils: 71 %
Nucleated RBC: 0 /100 WBC (ref 0.0–0.0)
Platelets: 98 10*3/uL — ABNORMAL LOW (ref 142–346)
RBC: 5.05 10*6/uL (ref 3.90–5.10)
RDW: 16 % — ABNORMAL HIGH (ref 11–15)
WBC: 4.78 10*3/uL (ref 3.10–9.50)

## 2021-03-16 LAB — COMPREHENSIVE METABOLIC PANEL
ALT: 21 U/L (ref 0–55)
AST (SGOT): 22 U/L (ref 5–41)
Albumin/Globulin Ratio: 0.9 (ref 0.9–2.2)
Albumin: 3.5 g/dL (ref 3.5–5.0)
Alkaline Phosphatase: 115 U/L (ref 37–117)
Anion Gap: 10 (ref 5.0–15.0)
BUN: 6 mg/dL — ABNORMAL LOW (ref 7.0–21.0)
Bilirubin, Total: 0.5 mg/dL (ref 0.2–1.2)
CO2: 25 mEq/L (ref 17–29)
Calcium: 8.6 mg/dL (ref 8.5–10.5)
Chloride: 104 mEq/L (ref 99–111)
Creatinine: 0.8 mg/dL (ref 0.4–1.0)
Globulin: 3.7 g/dL — ABNORMAL HIGH (ref 2.0–3.6)
Glucose: 112 mg/dL — ABNORMAL HIGH (ref 70–100)
Potassium: 4.2 mEq/L (ref 3.5–5.3)
Protein, Total: 7.2 g/dL (ref 6.0–8.3)
Sodium: 139 mEq/L (ref 135–145)

## 2021-03-16 LAB — MAGNESIUM: Magnesium: 1.7 mg/dL (ref 1.6–2.6)

## 2021-03-16 LAB — GGT: GGT: 12 U/L (ref 10–70)

## 2021-03-16 LAB — HEMOLYSIS INDEX: Hemolysis Index: 5 Index (ref 0–24)

## 2021-03-16 LAB — BILIRUBIN, DIRECT: Bilirubin Direct: 0.2 mg/dL (ref 0.0–0.5)

## 2021-03-16 LAB — TACROLIMUS LEVEL: FK506: <2 ng/mL — ABNORMAL LOW (ref 5.0–20.0)

## 2021-03-16 LAB — GFR: EGFR: >60

## 2021-03-16 LAB — SIROLIMUS LEVEL: Rapamycin: 9.1 ng/mL (ref 5.0–15.0)

## 2021-03-16 LAB — PHOSPHORUS: Phosphorus: 3.2 mg/dL (ref 2.3–4.7)

## 2021-03-17 LAB — EBV DNA, QUANTITATIVE PCR
Epstein-Barr Virus DNA Quan PCR Log Copies/ML: 4.01 Log cps/mL — ABNORMAL HIGH
Epstein-Barr virus, DNA, QUAN PCR: 10275 copies/mL — ABNORMAL HIGH

## 2021-03-17 LAB — CYTOMEGALOVIRUS DNA, QUANTITATIVE REAL-TIME PCR
CMV DNA, QN PCR: NOT DETECTED log IU/mL
CMV DNA,QN Real Time PCR: NOT DETECTED IU/mL

## 2021-03-31 ENCOUNTER — Telehealth (INDEPENDENT_AMBULATORY_CARE_PROVIDER_SITE_OTHER): Payer: Self-pay

## 2021-03-31 NOTE — Telephone Encounter (Signed)
RN attempted to call patient to schedule her on Monday April 05, 2021 at 12:00pm, per Dr. Marcelline Deist Request.    No answer, vm left.

## 2021-04-04 NOTE — Progress Notes (Unsigned)
[facility]   PEDIATRIC PULMONOLOGY OUTPATIENT VISIT    Patient Name: Alison Boone  Primary Care Physician: Dr. Lendell CapriceSullivan, Suzi RootsBrendan L, MD  Referring Physician: Dr. Bonnetta BarryNo ref. provider found    Patient Complaint:   No chief complaint on file.    History of Present Illness:   Alison CheMargaret is a 21 y.o. 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 She feels better and is back to baseline. She coughs sometimes but it is random. It is more noticeable in the morning and by the afternoon it has resolved. It is usually productive in the morning.  The EBV level has increased and is in the 19,000 range now. She has not been using the Advair consistently.    Alison Boone.    ***    Alison Boone is accompanied by *** who provides history.    Allergies:     Allergies   Allergen Reactions    Pistachio [Tree Nuts] Rash and Edema     Facial swelling       Medication:     Current Outpatient Medications   Medication Sig Dispense Refill    albuterol (ACCUNEB) 1.25 MG/3ML nebulizer solution Take 3 mLs (1.25 mg total) by nebulization 3 (three) times daily 270 mL 5    albuterol-ipratropium (DUO-NEB) 2.5-0.5(3) mg/3 mL nebulizer Take 3 mLs by nebulization 2 (two) times daily as needed (as needed per sick plan) 90 mL 5    cetirizine (ZYRTEC) 5 MG tablet Take 10 mg by mouth daily         dornase alpha (PULMOZYME) 1 MG/ML nebulizer solution Inhale 2.5 mg into the lungs.      Elexacaf-Tezacaf-Ivacaf&Ivacaf (Trikafta) 100-50-75 & 150 MG Tablet Therapy Pack       esomeprazole (NexIUM) 40 MG capsule Take 40 mg by mouth every morning before breakfast      fluticasone-salmeterol (Advair Diskus) 250-50 MCG/DOSE Aerosol Pwdr, Breath Activated Inhale 1 puff into the lungs 2 (two) times daily 1 each 5    insulin aspart (NOVOLOG) 100 UNIT/ML injection Inject into the skin 3 (three) times daily before meals.      Multiple Vitamins-Minerals (AQUADEKS PO) Take 2 tablets by mouth.      ONETOUCH VERIO test strip TEST 6  TIMES PER DAY  6    pancrelipase, Lip-Prot-Amyl, (CREON) 24000-76000 units Cap DR Particles Take 2 capsules of 24,000 units of lipase by mouth with meals and snacks. 300 capsule 5    sodium chloride 7 % Nebu Soln Take 4 mLs by nebulization 2 (two) times daily 240 mL 5    tacrolimus (ENVARSUS XR) extended release tablet 2 mg      vitamin D (CHOLECALCIFEROL) 400 UNIT tablet Take 400 Units by mouth daily.       No current facility-administered medications for this visit.       Past Medical History:     Past Medical History:   Diagnosis Date    Cystic fibrosis     Disease of lung     Disorder of liver        Past Medical History personally reviewed  Past Surgical History:     Past Surgical History:   Procedure Laterality Date    ORIF DISTAL RADIUS FRACTURE      ORIF, FOREARM Right 10/10/2013    Procedure: ORIF, FOREARM;  Surgeon: Briscoe DeutscherHattwick, Emily A, MD;  Location: [facility] OR;  Service: Orthopedics;  Laterality: Right;  RIGHT FOREARM OPEN REDUCTION INTERNAL FIXATION  W/  SYNTHES ELASTIC NAIL    REMOVAL HARDWARE UPPER EXTREMITY MINOR LESS THAN 1 HOUR Right 12/12/2013    Procedure: REMOVAL HARDWARE UPPER EXTREMITY MINOR LESS THAN 1 HOUR;  Surgeon: Briscoe Deutscher, MD;  Location: [facility] OR;  Service: Orthopedics;  Laterality: Right;  RIGHT RADIUS ROD REMOVAL       Family History:   No family history on file.  Family history personally reviewed  Social History:     Social History     Socioeconomic History    Marital status: Single   Tobacco Use    Smoking status: Never     Social history reviewed and updated  Review of Systems:     On review of systems there were no other pulmonary symptoms. Her sleep pattern was normal. Her appetite was improved with normal bowel movements. There was improvement in her reflux and she is well controlled on Nexium.  Growth and development were normal. There were no complaints referable to her cardiovascular, genitourinary, neurological or endocrine systems other than CFRD. Her blood sugar  has been under fair control recently with values of < 200. She is S/P liver transplant and continues to follow up with the transplant team. 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       Physical Exam:   There were no vitals filed for this visit.    Physical Exam  General: Thin but well appearing.   HEENT: Head: normocephalic  Ears: tympanic membranes normal bilaterally  Significant for: Nasal turbinates: boggy  Hard palate: high  Soft palate: Long  Mallampati class: 3  Tonsils: not seen, tongue coated  Neck: supple, no palpable cervical lymphadenopathy  Chest: symmetric with an increased AP diameter and no accessory muscle use. Equal and good aeration bilaterally with rare crackles over the left base and mid-axillary region. They clear after she breathes deeply.   Cardiac: S1 and S2 with no murmurs  Abdomen: flat, soft, non-tender, no palpable masses. Large abdominal scar.  Extremities: 1+ digital clubbing  Musculoskeletal: back midline  Neurological: grossly non-focal  Psychiatric: normal affect  Skin: clear and dry        Labs:    ***    Rads:      ***      Spirometry:        Assessment and Recommendations:   Cameka is a 21 y.o. 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   From a nutritional status [name] is stable  From a pulmonary standpoint her lung function has improved and is stable.     Hold Advair diskus 250/50 for now.      Rec. that she resume inhaled antibiotics and continue alternating cycling with [place] and TOBI (does not like podhaler).   Any exacerbations should be treated promptly.       Plan:  Continue nebs and chest PT 2 times/day  Continue rest of CF care plan without change including Trikafta - discussed potentially repeating sweat test    FUP: 1-2 months   .      No orders of the defined types were placed in this encounter.           Ediberto Sens R Judson Roch, MD, MD  Pediatric Pulmonology Physician  [facility] [place]  Dept: [phone]    04/04/2021 6:37  PM

## 2021-04-05 ENCOUNTER — Ambulatory Visit (INDEPENDENT_AMBULATORY_CARE_PROVIDER_SITE_OTHER): Payer: No Typology Code available for payment source | Admitting: Pediatric Pulmonology

## 2021-04-05 VITALS — HR 112 | Temp 97.2°F | Ht 62.99 in | Wt 114.0 lb

## 2021-04-05 DIAGNOSIS — Z944 Liver transplant status: Secondary | ICD-10-CM

## 2021-04-05 DIAGNOSIS — Z794 Long term (current) use of insulin: Secondary | ICD-10-CM

## 2021-04-05 DIAGNOSIS — E0800 Diabetes mellitus due to underlying condition with hyperosmolarity without nonketotic hyperglycemic-hyperosmolar coma (NKHHC): Secondary | ICD-10-CM

## 2021-04-05 DIAGNOSIS — K8689 Other specified diseases of pancreas: Secondary | ICD-10-CM

## 2021-04-05 DIAGNOSIS — J151 Pneumonia due to Pseudomonas: Secondary | ICD-10-CM

## 2021-04-05 DIAGNOSIS — D849 Immunodeficiency, unspecified: Secondary | ICD-10-CM

## 2021-04-05 MED ORDER — SULFAMETHOXAZOLE-TRIMETHOPRIM 800-160 MG PO TABS
1.0000 | ORAL_TABLET | Freq: Two times a day (BID) | ORAL | 1 refills | Status: AC
Start: 2021-04-05 — End: 2021-04-19

## 2021-04-05 MED ORDER — SODIUM CHLORIDE 7 % IN NEBU
4.0000 mL | INHALATION_SOLUTION | Freq: Two times a day (BID) | RESPIRATORY_TRACT | 5 refills | Status: AC
Start: 2021-04-05 — End: ?

## 2021-04-09 ENCOUNTER — Telehealth (INDEPENDENT_AMBULATORY_CARE_PROVIDER_SITE_OTHER): Payer: Self-pay

## 2021-04-09 ENCOUNTER — Other Ambulatory Visit (INDEPENDENT_AMBULATORY_CARE_PROVIDER_SITE_OTHER): Payer: Self-pay | Admitting: Pediatric Pulmonology

## 2021-04-09 ENCOUNTER — Encounter (INDEPENDENT_AMBULATORY_CARE_PROVIDER_SITE_OTHER): Payer: Self-pay | Admitting: Pediatric Pulmonology

## 2021-04-09 NOTE — Telephone Encounter (Signed)
E-mail sent to mother.

## 2021-04-23 ENCOUNTER — Encounter (INDEPENDENT_AMBULATORY_CARE_PROVIDER_SITE_OTHER): Payer: Self-pay | Admitting: Pediatric Pulmonology

## 2021-05-22 LAB — ACID FAST BACTERIAL SMEAR WITH CULTURE

## 2021-05-22 LAB — MYCOBACTERIAL IDENTIFICATION

## 2021-06-07 ENCOUNTER — Ambulatory Visit (INDEPENDENT_AMBULATORY_CARE_PROVIDER_SITE_OTHER): Payer: No Typology Code available for payment source | Admitting: Pediatric Pulmonology

## 2021-06-07 DIAGNOSIS — Z794 Long term (current) use of insulin: Secondary | ICD-10-CM

## 2021-06-07 DIAGNOSIS — J151 Pneumonia due to Pseudomonas: Secondary | ICD-10-CM

## 2021-06-07 DIAGNOSIS — E0800 Diabetes mellitus due to underlying condition with hyperosmolarity without nonketotic hyperglycemic-hyperosmolar coma (NKHHC): Secondary | ICD-10-CM

## 2021-06-07 DIAGNOSIS — K8689 Other specified diseases of pancreas: Secondary | ICD-10-CM

## 2021-06-07 DIAGNOSIS — R634 Abnormal weight loss: Secondary | ICD-10-CM

## 2021-06-07 DIAGNOSIS — D849 Immunodeficiency, unspecified: Secondary | ICD-10-CM

## 2021-06-07 MED ORDER — CIPROFLOXACIN HCL 750 MG PO TABS
750.0000 mg | ORAL_TABLET | Freq: Two times a day (BID) | ORAL | 0 refills | Status: AC
Start: 2021-06-07 — End: 2021-06-21

## 2021-06-07 NOTE — Progress Notes (Unsigned)
[facility]  PEDIATRIC PULMONOLOGY OUTPATIENT VISIT    Patient Name: Alison Boone  Primary Care Physician: Dr. Lendell Caprice, Suzi Roots, MD  Referring Physician: Dr. Lendell Caprice    Patient Complaint:   Follow-up    History of Present Illness:   Alison Boone is a 21 y.o. female who is here in consultation for .CF and PI.  She has been sick on and off since her last visit in January. She took Bactrim for 2 weeks and has been on Zithromax - she feels better when she is on antibiotics.  Her cough is more loose. It is more noticeable in the morning and by the afternoon it has improved. It is usually productive in the morning.  The EBV level is high.  She has received 2 rounds of Rituximab and will receive an additional 2 rounds next week and the following week.     Alison Boone is accompanied by mother but she provided the history.    Allergies:     Allergies   Allergen Reactions    Pistachio [Tree Nuts] Rash and Edema     Facial swelling       Medication:     Current Outpatient Medications   Medication Sig Dispense Refill    albuterol (ACCUNEB) 1.25 MG/3ML nebulizer solution Take 3 mLs (1.25 mg total) by nebulization 3 (three) times daily 270 mL 5    albuterol-ipratropium (DUO-NEB) 2.5-0.5(3) mg/3 mL nebulizer Take 3 mLs by nebulization 2 (two) times daily as needed (as needed per sick plan) 90 mL 5    cetirizine (ZYRTEC) 5 MG tablet Take 10 mg by mouth daily         dornase alpha (PULMOZYME) 1 MG/ML nebulizer solution Inhale 2.5 mg into the lungs.      Elexacaf-Tezacaf-Ivacaf&Ivacaf (Trikafta) 100-50-75 & 150 MG Tablet Therapy Pack       esomeprazole (NexIUM) 40 MG capsule Take 40 mg by mouth every morning before breakfast      fluticasone-salmeterol (Advair Diskus) 250-50 MCG/DOSE Aerosol Pwdr, Breath Activated Inhale 1 puff into the lungs 2 (two) times daily 1 each 5    insulin aspart (NOVOLOG) 100 UNIT/ML injection Inject into the skin 3 (three) times daily before meals.      Multiple Vitamins-Minerals  (AQUADEKS PO) Take 2 tablets by mouth.      ONETOUCH VERIO test strip TEST 6 TIMES PER DAY  6    pancrelipase, Lip-Prot-Amyl, (CREON) 24000-76000 units Cap DR Particles Take 2 capsules of 24,000 units of lipase by mouth with meals and snacks. 300 capsule 5    sirolimus (RAPAMUNE) 1 MG tablet Take 3 mg by mouth every 24 hours      sodium chloride 7 % Nebu Soln Take 4 mLs by nebulization 2 (two) times daily 240 mL 5    vitamin D (CHOLECALCIFEROL) 400 UNIT tablet Take 400 Units by mouth daily.       No current facility-administered medications for this visit.       Past Medical History:     Past Medical History:   Diagnosis Date    Cystic fibrosis     Disease of lung     Disorder of liver        Past Medical History personally reviewed  Past Surgical History:     Past Surgical History:   Procedure Laterality Date    ORIF DISTAL RADIUS FRACTURE      ORIF, FOREARM Right 10/10/2013    Procedure: ORIF, FOREARM;  Surgeon: Briscoe Deutscher, MD;  Location: [facility] OR;  Service: Orthopedics;  Laterality: Right;  RIGHT FOREARM OPEN REDUCTION INTERNAL FIXATION  W/ SYNTHES ELASTIC NAIL    REMOVAL HARDWARE UPPER EXTREMITY MINOR LESS THAN 1 HOUR Right 12/12/2013    Procedure: REMOVAL HARDWARE UPPER EXTREMITY MINOR LESS THAN 1 HOUR;  Surgeon: Briscoe Deutscher, MD;  Location: [facility] OR;  Service: Orthopedics;  Laterality: Right;  RIGHT RADIUS ROD REMOVAL       Family History:   No family history on file.  Family history personally reviewed  Social History:     Social History     Socioeconomic History    Marital status: Single   Tobacco Use    Smoking status: Never     Social history reviewed and updated  Review of Systems:     On review of systems there were no other pulmonary symptoms. Her sleep pattern was normal. Her appetite was good with loose bowel movements with the Rituximab and Zithromax. There was improvement in her reflux and she is well controlled on Nexium.  Growth and development were normal. There were no  complaints referable to her cardiovascular, genitourinary, neurological or endocrine systems other than CFRD. Her blood sugar has been under fair control recently with values of < 200. She is S/P liver transplant and continues to follow up with the transplant team. 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     Physical Exam:     Vitals:    06/07/21 1257   Pulse: (!) 138   Temp: 97.2 F (36.2 C)   TempSrc: Temporal   SpO2: 98%   Weight: 50 kg (110 lb 3.7 oz)   Height: 1.598 m (5' 2.91")       Physical Exam  General: Thin but well appearing.   HEENT: Head: normocephalic  Ears: tympanic membranes normal bilaterally  Significant for: Nasal turbinates: boggy  Hard palate: high  Soft palate: Long  Mallampati class: 3  Tonsils: not seen, tongue coated  Neck: supple, no palpable cervical lymphadenopathy  Chest: symmetric with an increased AP diameter and no accessory muscle use. Equal and good aeration bilaterally with crackles over the left base and mid-axillary region.   Cardiac: S1 and S2 with no murmurs  Abdomen: flat, soft, non-tender, no palpable masses. Large abdominal scar.  Extremities: 1+ digital clubbing  Musculoskeletal: back midline  Neurological: grossly non-focal  Psychiatric: normal affect  Skin: clear and dry        Labs:    ***    Rads:      ***      Spirometry:        Assessment and Recommendations:   Halleigh is a 21 y.o. female with ***.      No orders of the defined types were placed in this encounter.           Charlii Yost R Judson Roch, MD, MD  Pediatric Pulmonology Physician  [facility] [place]  Dept: [phone]    06/07/2021 1:10 PM

## 2021-06-09 ENCOUNTER — Encounter (INDEPENDENT_AMBULATORY_CARE_PROVIDER_SITE_OTHER): Payer: Self-pay | Admitting: Pediatric Pulmonology

## 2021-06-09 ENCOUNTER — Other Ambulatory Visit (FREE_STANDING_LABORATORY_FACILITY): Payer: No Typology Code available for payment source

## 2021-06-09 DIAGNOSIS — Z944 Liver transplant status: Secondary | ICD-10-CM

## 2021-06-09 LAB — COMPREHENSIVE METABOLIC PANEL
ALT: 18 U/L (ref 0–55)
AST (SGOT): 20 U/L (ref 5–41)
Albumin/Globulin Ratio: 0.8 — ABNORMAL LOW (ref 0.9–2.2)
Albumin: 3.5 g/dL (ref 3.5–5.0)
Alkaline Phosphatase: 98 U/L (ref 37–117)
Anion Gap: 8 (ref 5.0–15.0)
BUN: 5 mg/dL — ABNORMAL LOW (ref 7.0–21.0)
Bilirubin, Total: 0.3 mg/dL (ref 0.2–1.2)
CO2: 25 mEq/L (ref 17–29)
Calcium: 8.9 mg/dL (ref 8.5–10.5)
Chloride: 105 mEq/L (ref 99–111)
Creatinine: 0.7 mg/dL (ref 0.4–1.0)
Globulin: 4.4 g/dL — ABNORMAL HIGH (ref 2.0–3.6)
Glucose: 147 mg/dL — ABNORMAL HIGH (ref 70–100)
Potassium: 4.2 mEq/L (ref 3.5–5.3)
Protein, Total: 7.9 g/dL (ref 6.0–8.3)
Sodium: 138 mEq/L (ref 135–145)

## 2021-06-09 LAB — CELL MORPHOLOGY
Cell Morphology: NORMAL
Platelet Estimate: DECREASED — AB

## 2021-06-09 LAB — CBC AND DIFFERENTIAL
Absolute NRBC: 0 10*3/uL (ref 0.00–0.00)
Basophils Absolute Automated: 0.01 10*3/uL (ref 0.00–0.08)
Basophils Automated: 0.2 %
Eosinophils Absolute Automated: 0.11 10*3/uL (ref 0.00–0.44)
Eosinophils Automated: 2.1 %
Hematocrit: 37.1 % (ref 34.7–43.7)
Hgb: 11.1 g/dL — ABNORMAL LOW (ref 11.4–14.8)
Immature Granulocytes Absolute: 0.02 10*3/uL (ref 0.00–0.07)
Immature Granulocytes: 0.4 %
Instrument Absolute Neutrophil Count: 3.81 10*3/uL (ref 1.10–6.33)
Lymphocytes Absolute Automated: 1.02 10*3/uL (ref 0.42–3.22)
Lymphocytes Automated: 19.3 %
MCH: 20.5 pg — ABNORMAL LOW (ref 25.1–33.5)
MCHC: 29.9 g/dL — ABNORMAL LOW (ref 31.5–35.8)
MCV: 68.5 fL — ABNORMAL LOW (ref 78.0–96.0)
MPV: 9.7 fL (ref 8.9–12.5)
Monocytes Absolute Automated: 0.31 10*3/uL (ref 0.21–0.85)
Monocytes: 5.9 %
Neutrophils Absolute: 3.81 10*3/uL (ref 1.10–6.33)
Neutrophils: 72.1 %
Nucleated RBC: 0 /100 WBC (ref 0.0–0.0)
Platelets: 131 10*3/uL — ABNORMAL LOW (ref 142–346)
RBC: 5.42 10*6/uL — ABNORMAL HIGH (ref 3.90–5.10)
RDW: 22 % — ABNORMAL HIGH (ref 11–15)
WBC: 5.28 10*3/uL (ref 3.10–9.50)

## 2021-06-09 LAB — GFR: EGFR: >60

## 2021-06-09 LAB — BILIRUBIN, DIRECT: Bilirubin Direct: 0.1 mg/dL (ref 0.0–0.5)

## 2021-06-09 LAB — SIROLIMUS LEVEL: Rapamycin: 7.5 ng/mL (ref 5.0–15.0)

## 2021-06-09 LAB — MAGNESIUM: Magnesium: 1.7 mg/dL (ref 1.6–2.6)

## 2021-06-09 LAB — HEMOLYSIS INDEX: Hemolysis Index: 2 Index (ref 0–24)

## 2021-06-09 LAB — PHOSPHORUS: Phosphorus: 2.8 mg/dL (ref 2.3–4.7)

## 2021-06-09 LAB — GGT: GGT: 14 U/L (ref 10–70)

## 2021-06-10 LAB — EBV DNA, QUANTITATIVE PCR
Epstein-Barr Virus DNA Quan PCR Log Copies/ML: <2.3 Log cps/mL — ABNORMAL HIGH
Epstein-Barr virus, DNA, QUAN PCR: <200 copies/mL — ABNORMAL HIGH

## 2021-06-10 LAB — CYTOMEGALOVIRUS DNA, QUANTITATIVE REAL-TIME PCR
CMV DNA, QN PCR: NOT DETECTED log IU/mL
CMV DNA,QN Real Time PCR: NOT DETECTED IU/mL

## 2021-07-02 ENCOUNTER — Other Ambulatory Visit (INDEPENDENT_AMBULATORY_CARE_PROVIDER_SITE_OTHER): Payer: Self-pay | Admitting: Pediatric Pulmonology

## 2021-07-02 MED ORDER — ALBUTEROL SULFATE (2.5 MG/3ML) 0.083% IN NEBU
2.5000 mg | INHALATION_SOLUTION | Freq: Four times a day (QID) | RESPIRATORY_TRACT | 5 refills | Status: AC
Start: 2021-07-02 — End: ?

## 2021-07-02 NOTE — Progress Notes (Incomplete)
ggt

## 2021-07-06 LAB — COMPREHENSIVE METABOLIC PANEL
ALT: 20 IU/L (ref 0–32)
AST (SGOT): 24 IU/L (ref 0–40)
Albumin/Globulin Ratio: 1.1 — ABNORMAL LOW (ref 1.2–2.2)
Albumin: 4 g/dL (ref 3.9–5.0)
Alkaline Phosphatase: 111 IU/L (ref 44–121)
BUN / Creatinine Ratio: 15 (ref 9–23)
BUN: 12 mg/dL (ref 6–20)
Bilirubin, Total: <0.2 mg/dL (ref 0.0–1.2)
CO2: 23 mmol/L (ref 20–29)
Calcium: 9.4 mg/dL (ref 8.7–10.2)
Chloride: 105 mmol/L (ref 96–106)
Creatinine: 0.82 mg/dL (ref 0.57–1.00)
Globulin, Total: 3.5 g/dL (ref 1.5–4.5)
Glucose: 112 mg/dL — ABNORMAL HIGH (ref 70–99)
Potassium: 4.5 mmol/L (ref 3.5–5.2)
Protein, Total: 7.5 g/dL (ref 6.0–8.5)
Sodium: 141 mmol/L (ref 134–144)
eGFR: 104 mL/min/{1.73_m2} (ref >59)

## 2021-07-06 LAB — CBC AND DIFFERENTIAL
Baso(Absolute): 0 10*3/uL (ref 0.0–0.2)
Basophils Automated: 1 %
Eosinophils Absolute: 0.3 10*3/uL (ref 0.0–0.4)
Eosinophils Automated: 5 %
Hematocrit: 33.4 % — ABNORMAL LOW (ref 34.0–46.6)
Hemoglobin: 9.9 g/dL — ABNORMAL LOW (ref 11.1–15.9)
Immature Granulocytes Absolute: 0 10*3/uL (ref 0.0–0.1)
Immature Granulocytes: 0 %
Lymphocytes Absolute: 1 10*3/uL (ref 0.7–3.1)
Lymphocytes Automated: 18 %
MCH: 19.6 pg — ABNORMAL LOW (ref 26.6–33.0)
MCHC: 29.6 g/dL — ABNORMAL LOW (ref 31.5–35.7)
MCV: 66 fL — ABNORMAL LOW (ref 79–97)
Monocytes Absolute: 0.3 10*3/uL (ref 0.1–0.9)
Monocytes: 6 %
Neutrophils Absolute Count: 4 10*3/uL (ref 1.4–7.0)
Neutrophils: 70 %
Platelets: 143 10*3/uL — ABNORMAL LOW (ref 150–450)
RBC: 5.05 x10E6/uL (ref 3.77–5.28)
RDW: 18.6 % — ABNORMAL HIGH (ref 11.7–15.4)
WBC: 5.7 10*3/uL (ref 3.4–10.8)

## 2021-07-06 LAB — GGT: Gamma Gluten Transferase: 13 IU/L (ref 0–60)

## 2021-08-18 ENCOUNTER — Telehealth (INDEPENDENT_AMBULATORY_CARE_PROVIDER_SITE_OTHER): Payer: Self-pay

## 2021-08-18 NOTE — Telephone Encounter (Signed)
Phone call placed to scchedule pt on 5/30 @1pm  for an in person appt per Dr. Marcelline Deist. No answer, left detailed VM.

## 2021-08-24 ENCOUNTER — Ambulatory Visit (INDEPENDENT_AMBULATORY_CARE_PROVIDER_SITE_OTHER): Payer: No Typology Code available for payment source | Admitting: Pediatric Pulmonology

## 2021-08-24 VITALS — HR 134 | Temp 97.3°F | Ht 63.15 in | Wt 109.1 lb

## 2021-08-24 DIAGNOSIS — Z794 Long term (current) use of insulin: Secondary | ICD-10-CM

## 2021-08-24 DIAGNOSIS — R634 Abnormal weight loss: Secondary | ICD-10-CM

## 2021-08-24 DIAGNOSIS — Z944 Liver transplant status: Secondary | ICD-10-CM

## 2021-08-24 DIAGNOSIS — E0800 Diabetes mellitus due to underlying condition with hyperosmolarity without nonketotic hyperglycemic-hyperosmolar coma (NKHHC): Secondary | ICD-10-CM

## 2021-08-24 DIAGNOSIS — K8689 Other specified diseases of pancreas: Secondary | ICD-10-CM

## 2021-08-24 DIAGNOSIS — J151 Pneumonia due to Pseudomonas: Secondary | ICD-10-CM

## 2021-08-24 DIAGNOSIS — D849 Immunodeficiency, unspecified: Secondary | ICD-10-CM

## 2021-08-24 MED ORDER — MONTELUKAST SODIUM 10 MG PO TABS
10.0000 mg | ORAL_TABLET | Freq: Every evening | ORAL | 3 refills | Status: AC
Start: 2021-08-24 — End: ?

## 2021-08-24 NOTE — Progress Notes (Signed)
[facility]  PEDIATRIC PULMONOLOGY OUTPATIENT VISIT    Patient Name: Alison Boone  Primary Care Physician: Dr. Lendell Caprice, Suzi Roots, MD  Referring Physician: Dr. Lendell Caprice    Patient Complaint:   Follow-up    History of Present Illness:   Alison Boone is a 21 y.o. 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  She feels better when she is on antibiotics however 1-2 weeks after stopping she starts to feel unwell and the cough recurs.  Her cough is more loose at the present time. It is more noticeable in the morning and by the afternoon it has improved. It is usually productive in the morning.  She has been on Meropenum and Tobramycin for the last 9 days IV.     Alison Boone is accompanied by mother but she provides history.    Allergies:     Allergies   Allergen Reactions    Pistachio [Tree Nuts] Rash and Edema     Facial swelling       Medication:     Current Outpatient Medications   Medication Sig Dispense Refill    albuterol (ACCUNEB) 1.25 MG/3ML nebulizer solution Take 3 mLs (1.25 mg total) by nebulization 3 (three) times daily 270 mL 5    albuterol (PROVENTIL) (2.5 MG/3ML) 0.083% nebulizer solution Take 3 mLs (2.5 mg) by nebulization 4 (four) times daily 360 mL 5    albuterol-ipratropium (DUO-NEB) 2.5-0.5(3) mg/3 mL nebulizer Take 3 mLs by nebulization 2 (two) times daily as needed (as needed per sick plan) 90 mL 5    cetirizine (ZYRTEC) 5 MG tablet Take 2 tablets (10 mg) by mouth daily      dornase alpha (PULMOZYME) 1 MG/ML nebulizer solution Inhale 2.5 mLs (2.5 mg) into the lungs      Elexacaf-Tezacaf-Ivacaf&Ivacaf (Trikafta) 100-50-75 & 150 MG Tablet Therapy Pack       esomeprazole (NexIUM) 40 MG capsule Take 1 capsule (40 mg) by mouth every morning before breakfast      fluticasone-salmeterol (Advair Diskus) 250-50 MCG/DOSE Aerosol Pwdr, Breath Activated Inhale 1 puff into the lungs 2 (two) times daily 1 each 5    insulin aspart (NOVOLOG) 100 UNIT/ML injection Inject into the skin 3  (three) times daily before meals.      Multiple Vitamins-Minerals (AQUADEKS PO) Take 2 tablets by mouth.      ONETOUCH VERIO test strip TEST 6 TIMES PER DAY  6    pancrelipase, Lip-Prot-Amyl, (CREON) 24000-76000 units Cap DR Particles Take 2 capsules of 24,000 units of lipase by mouth with meals and snacks. 300 capsule 5    sirolimus (RAPAMUNE) 1 MG tablet Take 3 tablets (3 mg) by mouth every 24 hours      sodium chloride 7 % Nebu Soln Take 4 mLs by nebulization 2 (two) times daily 240 mL 5    vitamin D (CHOLECALCIFEROL) 400 UNIT tablet Take 1 tablet (400 Units) by mouth daily       No current facility-administered medications for this visit.       Past Medical History:     Past Medical History:   Diagnosis Date    Cystic fibrosis     Disease of lung     Disorder of liver        Past Medical History personally reviewed  Past Surgical History:     Past Surgical History:   Procedure Laterality Date    ORIF DISTAL RADIUS FRACTURE      ORIF, FOREARM Right 10/10/2013  Procedure: ORIF, FOREARM;  Surgeon: Briscoe Deutscher, MD;  Location: [facility] OR;  Service: Orthopedics;  Laterality: Right;  RIGHT FOREARM OPEN REDUCTION INTERNAL FIXATION  W/ SYNTHES ELASTIC NAIL    REMOVAL HARDWARE UPPER EXTREMITY MINOR LESS THAN 1 HOUR Right 12/12/2013    Procedure: REMOVAL HARDWARE UPPER EXTREMITY MINOR LESS THAN 1 HOUR;  Surgeon: Briscoe Deutscher, MD;  Location: [facility] OR;  Service: Orthopedics;  Laterality: Right;  RIGHT RADIUS ROD REMOVAL       Family History:   No family history on file.  Family history personally reviewed  Social History:     Social History     Socioeconomic History    Marital status: Single   Tobacco Use    Smoking status: Never     Social history reviewed and updated  Review of Systems:     On review of systems there were no other pulmonary symptoms. Her sleep pattern was normal. Her appetite was good with loose bowel movements because of the IV antibiotics. Her reflux is well controlled on Nexium.   Growth and development were normal. There were no complaints referable to her cardiovascular, genitourinary, neurological or endocrine systems other than CFRD. Her blood sugar has been under fair control. She is S/P liver transplant and continues to follow up with the transplant team. 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       Physical Exam:     Vitals:    08/24/21 1251   Pulse: (!) 134   Temp: 97.3 F (36.3 C)   TempSrc: Temporal   SpO2: 95%   Weight: 49.5 kg (109 lb 2 oz)   Height: 1.604 m (5' 3.15")       Physical Exam  General: Thin but well appearing.   HEENT: Head: normocephalic  Ears: tympanic membranes normal bilaterally  Significant for: Nasal turbinates: boggy  Hard palate: high  Soft palate: Long  Mallampati class: 3  Tonsils: not seen, tongue coated  Neck: supple, no palpable cervical lymphadenopathy  Chest: symmetric with an increased AP diameter and no accessory muscle use. Equal and good aeration bilaterally with crackles over the left base and mid-axillary region.  Air exchange improved c/w prior exam.   Cardiac: S1 and S2 with no murmurs  Abdomen: flat, soft, non-tender, no palpable masses. Large abdominal scar.  Extremities: 1+ digital clubbing  Musculoskeletal: back midline  Neurological: grossly non-focal  Psychiatric: normal affect  Skin: clear and dry     Labs:        Rads:          Spirometry:    Severe lower airway obstruction with worsening c/w prior study.     Assessment and Recommendations:   Alison Boone is a 21 y.o. female with CF, PI, CFRD and S/P liver transplant 2 years ago.    From a nutritional status Alison Boone has lost 1 kg of weight.    From a pulmonary standpoint her lung function has not improved despite more than a week of IV antibiotics - this may also be related to immunosuppression.    We discussed whether we should change her antibiotic regimen (Meropenum to Ceftaz). She would prefer to continue on the same regimen and give herself more time.  She recently used  Ceftazidime but relapsed within a short time.   She will continue Azithromycin - Mon, Wed, Fri on a long-term basis when she completes the course of IV antibiotics     Rec. that she continuee inhaled antibiotics  and continue alternating cycling with [place] and TOBI (does not like podhaler) once she is off IVs    Continue nebs and chest PT 3 times/day  Continue rest of CF care plan without change including Trikafta - discussed potentially repeating sweat test.      We also discussed transition to adult care as she will be 21 at the end of this year.  Alison Boone would prefer to do that after completing undergraduate college.  We discussed providers and she will opt for [facility] team as her liver transplant team is at the same institution and coordination of care will be easier.     FUP: 1 week            Verita Kuroda R Sami Clent Ridges, MD, MD  Pediatric Pulmonology Physician  [facility]  Dept: [phone]    08/24/2021 1:02 PM

## 2021-08-25 ENCOUNTER — Encounter (INDEPENDENT_AMBULATORY_CARE_PROVIDER_SITE_OTHER): Payer: Self-pay | Admitting: Pediatric Pulmonology

## 2021-08-26 ENCOUNTER — Encounter (INDEPENDENT_AMBULATORY_CARE_PROVIDER_SITE_OTHER): Payer: Self-pay | Admitting: Pediatric Pulmonology

## 2021-08-26 ENCOUNTER — Other Ambulatory Visit (FREE_STANDING_LABORATORY_FACILITY): Payer: No Typology Code available for payment source

## 2021-08-26 DIAGNOSIS — Z944 Liver transplant status: Secondary | ICD-10-CM

## 2021-08-26 LAB — CBC AND DIFFERENTIAL
Absolute NRBC: 0 10*3/uL (ref 0.00–0.00)
Basophils Absolute Automated: 0.01 10*3/uL (ref 0.00–0.08)
Basophils Automated: 0.3 %
Eosinophils Absolute Automated: 0.28 10*3/uL (ref 0.00–0.44)
Eosinophils Automated: 8.5 %
Hematocrit: 42 % (ref 34.7–43.7)
Hgb: 12.9 g/dL (ref 11.4–14.8)
Immature Granulocytes Absolute: 0.01 10*3/uL (ref 0.00–0.07)
Immature Granulocytes: 0.3 %
Instrument Absolute Neutrophil Count: 1.88 10*3/uL (ref 1.10–6.33)
Lymphocytes Absolute Automated: 0.93 10*3/uL (ref 0.42–3.22)
Lymphocytes Automated: 28.3 %
MCH: 21 pg — ABNORMAL LOW (ref 25.1–33.5)
MCHC: 30.7 g/dL — ABNORMAL LOW (ref 31.5–35.8)
MCV: 68.3 fL — ABNORMAL LOW (ref 78.0–96.0)
MPV: 10 fL (ref 8.9–12.5)
Monocytes Absolute Automated: 0.18 10*3/uL — ABNORMAL LOW (ref 0.21–0.85)
Monocytes: 5.5 %
Neutrophils Absolute: 1.88 10*3/uL (ref 1.10–6.33)
Neutrophils: 57.1 %
Nucleated RBC: 0 /100 WBC (ref 0.0–0.0)
Platelets: 130 10*3/uL — ABNORMAL LOW (ref 142–346)
RBC: 6.15 10*6/uL — ABNORMAL HIGH (ref 3.90–5.10)
RDW: 19 % — ABNORMAL HIGH (ref 11–15)
WBC: 3.29 10*3/uL (ref 3.10–9.50)

## 2021-08-26 LAB — COMPREHENSIVE METABOLIC PANEL
ALT: 49 U/L (ref 0–55)
AST (SGOT): 53 U/L — ABNORMAL HIGH (ref 5–41)
Albumin/Globulin Ratio: 0.8 — ABNORMAL LOW (ref 0.9–2.2)
Albumin: 3.4 g/dL — ABNORMAL LOW (ref 3.5–5.0)
Alkaline Phosphatase: 130 U/L — ABNORMAL HIGH (ref 37–117)
Anion Gap: 9 (ref 5.0–15.0)
BUN: 6 mg/dL — ABNORMAL LOW (ref 7.0–21.0)
Bilirubin, Total: 0.4 mg/dL (ref 0.2–1.2)
CO2: 25 mEq/L (ref 17–29)
Calcium: 9 mg/dL (ref 8.5–10.5)
Chloride: 104 mEq/L (ref 99–111)
Creatinine: 0.8 mg/dL (ref 0.4–1.0)
Globulin: 4.3 g/dL — ABNORMAL HIGH (ref 2.0–3.6)
Glucose: 148 mg/dL — ABNORMAL HIGH (ref 70–100)
Potassium: 4.4 mEq/L (ref 3.5–5.3)
Protein, Total: 7.7 g/dL (ref 6.0–8.3)
Sodium: 138 mEq/L (ref 135–145)
eGFR: >60 mL/min/{1.73_m2} (ref >=60)

## 2021-08-26 LAB — HEMOLYSIS INDEX: Hemolysis Index: 7 Index (ref 0–24)

## 2021-08-26 LAB — GGT: GGT: 13 U/L (ref 10–70)

## 2021-08-26 LAB — BILIRUBIN, DIRECT: Bilirubin Direct: 0.1 mg/dL (ref 0.0–0.5)

## 2021-08-26 LAB — SIROLIMUS LEVEL: Rapamycin: 5.5 ng/mL (ref 5.0–15.0)

## 2021-08-26 LAB — MAGNESIUM: Magnesium: 2.1 mg/dL (ref 1.6–2.6)

## 2021-08-26 LAB — PHOSPHORUS: Phosphorus: 2.2 mg/dL — ABNORMAL LOW (ref 2.3–4.7)

## 2021-08-27 LAB — EBV DNA, QUANTITATIVE PCR
Epstein-Barr Virus DNA Quan PCR Log Copies/ML: 3.59 Log cps/mL — ABNORMAL HIGH
Epstein-Barr virus, DNA, QUAN PCR: 3904 copies/mL — ABNORMAL HIGH

## 2021-08-27 LAB — CYTOMEGALOVIRUS DNA, QUANTITATIVE REAL-TIME PCR
CMV DNA, QN PCR: NOT DETECTED log IU/mL
CMV DNA,QN Real Time PCR: NOT DETECTED IU/mL

## 2021-08-31 IMAGING — CT CT Abdomen and Pelvis W Contrast
1 series · 1 of 2 positions shown · IV contrast (omnipaque)
Comparison: None.

CT Abdomen and Pelvis W-Contrast
INDICATION: Abdominal Pain.                                                              
 Pertinent History: abdominal pain. Patient states this started x4 days ago                
 Patient history of cystic fibrosis.                                                       
 Surgical History:                                                                         
 Cancer: None                                                                              
 GFR (past 30 days): >39 ml/min                                                            
 Metformin: None                                                                           
 Lipase: N/A       Amylase: N/A      WBC: N/A                                              
 Intravenous contrast: 100 mL Omnipaque 350                                                
 Oral contrast: No                                                                         
 Comments: neg hcg
TECHNIQUE: Routine with IV contrast. Helical acquisition with sagittal and                
 coronal reformations; post IV contrast images.                                            
 Utilized dose reduction techniques include: Automated Exposure Control, vendor            
 specific iterative reconstruction technique

[Series 2: scout · coronal · 2.00mm/px · 1 of 2 slices shown]
[im 2/2]
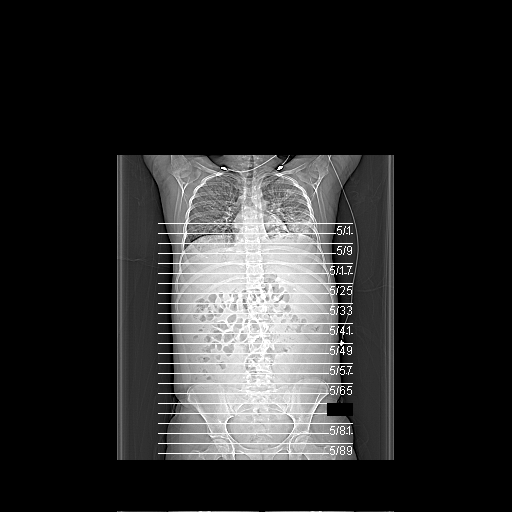

[1 of 2 positions shown; findings below may reference images not displayed]

FINDINGS: For findings above the diaphragms please for to CT scan of the                  
 chest                                                                                     
 report.                                                                                   
 The spleen is enlarged. There is a large amount of ascites. The bladder is                
 unremarkable. The liver is markedly abnormal with abnormal enhancement. The               
 parenchymal displays low-attenuation nodular areas with peripheral enhancement            
 diffusely. Overall, the liver is small in size.                                           
 There are extensive varices within the lower mediastinum around the esophagus             
 and descending thoracic aorta. The splenic vein is enlarged. There are also               
 varices within the retroperitoneum of the upper abdomen.                                  
 There is recanalization of the umbilical vein. There is no hydronephrosis. The            
 adrenal glands are unremarkable. No suspicious renal lesion is seen. There is no          
 dilated bowel. Several loops of colon display wall thickening possibly secondary          
 to edema. The left colon is worse than the right. Colitis cannot be excluded.             
 The osseous structures are unremarkable. The gallbladder appears                          
 contracted.
IMPRESSION: Large amount of ascites.                                                                  
 Markedly abnormal liver as described above. This may represent severe                     
 cirrhosis.                                                                                
 Comparison with prior study studies would be helpful. Consider follow-up                  
 nonemergent MRI for further characterization.                                             
 Splenomegaly and portal hypertension with multiple varices within the lower               
 mediastinum and upper abdomen.                                                            
 Wall thickening of the colon, especially the left colon, nonspecific. This may            
 be related to the same etiology as the ascites, colitis cannot be entirely                
 excluded.

## 2021-09-07 ENCOUNTER — Other Ambulatory Visit (FREE_STANDING_LABORATORY_FACILITY): Payer: No Typology Code available for payment source

## 2021-09-07 DIAGNOSIS — Z944 Liver transplant status: Secondary | ICD-10-CM

## 2021-09-07 LAB — COMPREHENSIVE METABOLIC PANEL
ALT: 72 U/L — ABNORMAL HIGH (ref 0–55)
AST (SGOT): 53 U/L — ABNORMAL HIGH (ref 5–41)
Albumin/Globulin Ratio: 0.8 — ABNORMAL LOW (ref 0.9–2.2)
Albumin: 3.7 g/dL (ref 3.5–5.0)
Alkaline Phosphatase: 162 U/L — ABNORMAL HIGH (ref 37–117)
Anion Gap: 12 (ref 5.0–15.0)
BUN: 11 mg/dL (ref 7.0–21.0)
Bilirubin, Total: 0.6 mg/dL (ref 0.2–1.2)
CO2: 24 mEq/L (ref 17–29)
Calcium: 9.1 mg/dL (ref 8.5–10.5)
Chloride: 101 mEq/L (ref 99–111)
Creatinine: 0.9 mg/dL (ref 0.4–1.0)
Globulin: 4.4 g/dL — ABNORMAL HIGH (ref 2.0–3.6)
Glucose: 164 mg/dL — ABNORMAL HIGH (ref 70–100)
Potassium: 4.1 mEq/L (ref 3.5–5.3)
Protein, Total: 8.1 g/dL (ref 6.0–8.3)
Sodium: 137 mEq/L (ref 135–145)
eGFR: >60 mL/min/{1.73_m2} (ref >=60)

## 2021-09-07 LAB — CBC AND DIFFERENTIAL
Absolute NRBC: 0 10*3/uL (ref 0.00–0.00)
Basophils Absolute Automated: 0.03 10*3/uL (ref 0.00–0.08)
Basophils Automated: 0.8 %
Eosinophils Absolute Automated: 0.31 10*3/uL (ref 0.00–0.44)
Eosinophils Automated: 8 %
Hematocrit: 43.2 % (ref 34.7–43.7)
Hgb: 13.6 g/dL (ref 11.4–14.8)
Immature Granulocytes Absolute: 0.01 10*3/uL (ref 0.00–0.07)
Immature Granulocytes: 0.3 %
Instrument Absolute Neutrophil Count: 2.21 10*3/uL (ref 1.10–6.33)
Lymphocytes Absolute Automated: 1.06 10*3/uL (ref 0.42–3.22)
Lymphocytes Automated: 27.3 %
MCH: 20.9 pg — ABNORMAL LOW (ref 25.1–33.5)
MCHC: 31.5 g/dL (ref 31.5–35.8)
MCV: 66.4 fL — ABNORMAL LOW (ref 78.0–96.0)
Monocytes Absolute Automated: 0.26 10*3/uL (ref 0.21–0.85)
Monocytes: 6.7 %
Neutrophils Absolute: 2.21 10*3/uL (ref 1.10–6.33)
Neutrophils: 56.9 %
Nucleated RBC: 0 /100 WBC (ref 0.0–0.0)
Platelets: 110 10*3/uL — ABNORMAL LOW (ref 142–346)
RBC: 6.51 10*6/uL — ABNORMAL HIGH (ref 3.90–5.10)
RDW: 19 % — ABNORMAL HIGH (ref 11–15)
WBC: 3.88 10*3/uL (ref 3.10–9.50)

## 2021-09-07 LAB — BILIRUBIN, DIRECT: Bilirubin Direct: 0.2 mg/dL (ref 0.0–0.5)

## 2021-09-07 LAB — MAGNESIUM: Magnesium: 1.9 mg/dL (ref 1.6–2.6)

## 2021-09-07 LAB — PHOSPHORUS: Phosphorus: 2.8 mg/dL (ref 2.3–4.7)

## 2021-09-07 LAB — HEMOLYSIS INDEX: Hemolysis Index: 4 Index (ref 0–24)

## 2021-09-07 LAB — GGT: GGT: 13 U/L (ref 10–70)

## 2021-09-07 LAB — SIROLIMUS LEVEL: Rapamycin: 11.2 ng/mL (ref 5.0–15.0)

## 2021-09-09 ENCOUNTER — Encounter (INDEPENDENT_AMBULATORY_CARE_PROVIDER_SITE_OTHER): Payer: Self-pay | Admitting: Pediatric Pulmonology

## 2021-09-09 LAB — CYTOMEGALOVIRUS DNA, QUANTITATIVE REAL-TIME PCR
CMV DNA, QN PCR: NOT DETECTED log IU/mL
CMV DNA,QN Real Time PCR: NOT DETECTED IU/mL

## 2021-09-09 LAB — EBV DNA, QUANTITATIVE PCR
Epstein-Barr Virus DNA Quan PCR Log Copies/ML: 3.83 Log cps/mL — ABNORMAL HIGH
Epstein-Barr virus, DNA, QUAN PCR: 6787 copies/mL — ABNORMAL HIGH

## 2021-10-18 ENCOUNTER — Other Ambulatory Visit (FREE_STANDING_LABORATORY_FACILITY): Payer: No Typology Code available for payment source

## 2021-10-18 ENCOUNTER — Other Ambulatory Visit (INDEPENDENT_AMBULATORY_CARE_PROVIDER_SITE_OTHER): Payer: Self-pay | Admitting: Pediatric Pulmonology

## 2021-10-18 DIAGNOSIS — E089 Diabetes mellitus due to underlying condition without complications: Secondary | ICD-10-CM

## 2021-10-18 DIAGNOSIS — Z944 Liver transplant status: Secondary | ICD-10-CM

## 2021-10-18 DIAGNOSIS — N911 Secondary amenorrhea: Secondary | ICD-10-CM

## 2021-10-18 LAB — COMPREHENSIVE METABOLIC PANEL
ALT: 20 U/L (ref 0–55)
AST (SGOT): 19 U/L (ref 5–41)
Albumin/Globulin Ratio: 0.8 — ABNORMAL LOW (ref 0.9–2.2)
Albumin: 3.5 g/dL (ref 3.5–5.0)
Alkaline Phosphatase: 132 U/L — ABNORMAL HIGH (ref 37–117)
Anion Gap: 10 (ref 5.0–15.0)
BUN: 6 mg/dL — ABNORMAL LOW (ref 7.0–21.0)
Bilirubin, Total: 0.5 mg/dL (ref 0.2–1.2)
CO2: 25 mEq/L (ref 17–29)
Calcium: 8.9 mg/dL (ref 8.5–10.5)
Chloride: 101 mEq/L (ref 99–111)
Creatinine: 0.8 mg/dL (ref 0.4–1.0)
Globulin: 4.5 g/dL — ABNORMAL HIGH (ref 2.0–3.6)
Glucose: 193 mg/dL — ABNORMAL HIGH (ref 70–100)
Potassium: 3.9 mEq/L (ref 3.5–5.3)
Protein, Total: 8 g/dL (ref 6.0–8.3)
Sodium: 136 mEq/L (ref 135–145)
eGFR: >60 mL/min/{1.73_m2} (ref >=60)

## 2021-10-18 LAB — CBC AND DIFFERENTIAL
Absolute NRBC: 0 10*3/uL (ref 0.00–0.00)
Basophils Absolute Automated: 0.02 10*3/uL (ref 0.00–0.08)
Basophils Automated: 0.4 %
Eosinophils Absolute Automated: 0.1 10*3/uL (ref 0.00–0.44)
Eosinophils Automated: 2 %
Hematocrit: 40.4 % (ref 34.7–43.7)
Hgb: 12.1 g/dL (ref 11.4–14.8)
Immature Granulocytes Absolute: 0.01 10*3/uL (ref 0.00–0.07)
Immature Granulocytes: 0.2 %
Instrument Absolute Neutrophil Count: 3.53 10*3/uL (ref 1.10–6.33)
Lymphocytes Absolute Automated: 1.08 10*3/uL (ref 0.42–3.22)
Lymphocytes Automated: 21.5 %
MCH: 20.7 pg — ABNORMAL LOW (ref 25.1–33.5)
MCHC: 30 g/dL — ABNORMAL LOW (ref 31.5–35.8)
MCV: 69.2 fL — ABNORMAL LOW (ref 78.0–96.0)
Monocytes Absolute Automated: 0.29 10*3/uL (ref 0.21–0.85)
Monocytes: 5.8 %
Neutrophils Absolute: 3.53 10*3/uL (ref 1.10–6.33)
Neutrophils: 70.1 %
Nucleated RBC: 0 /100 WBC (ref 0.0–0.0)
Platelets: 128 10*3/uL — ABNORMAL LOW (ref 142–346)
RBC: 5.84 10*6/uL — ABNORMAL HIGH (ref 3.90–5.10)
RDW: 19 % — ABNORMAL HIGH (ref 11–15)
WBC: 5.03 10*3/uL (ref 3.10–9.50)

## 2021-10-18 LAB — MICROALBUMIN, RANDOM URINE
Urine Creatinine, Random: 179.1 mg/dL
Urine Microalbumin, Random: 76 ug/ml — ABNORMAL HIGH (ref 0.0–30.0)
Urine Microalbumin/Creatinine Ratio: 42 ug/mg — ABNORMAL HIGH (ref 0–30)

## 2021-10-18 LAB — HEMOLYSIS INDEX: Hemolysis Index: 14 Index (ref 0–24)

## 2021-10-18 LAB — MAGNESIUM: Magnesium: 1.8 mg/dL (ref 1.6–2.6)

## 2021-10-18 LAB — THYROID STIMULATING HORMONE (TSH), REFLEX ON ABNORMAL TO FREE T4, SERUM: TSH, Abn Reflex to Free T4, Serum: 0.72 u[IU]/mL (ref 0.35–4.94)

## 2021-10-18 LAB — SIROLIMUS LEVEL: Rapamycin: 8.8 ng/mL (ref 5.0–15.0)

## 2021-10-18 LAB — PHOSPHORUS: Phosphorus: 3.4 mg/dL (ref 2.3–4.7)

## 2021-10-18 LAB — T3, FREE: T3, Free: 2.2 pg/mL (ref 1.71–3.71)

## 2021-10-18 LAB — BILIRUBIN, DIRECT: Bilirubin Direct: 0.2 mg/dL (ref 0.0–0.5)

## 2021-10-18 LAB — GGT: GGT: <4 U/L — ABNORMAL LOW (ref 10–70)

## 2021-10-19 LAB — CYTOMEGALOVIRUS DNA, QUANTITATIVE REAL-TIME PCR
CMV DNA, QN PCR: NOT DETECTED log IU/mL
CMV DNA,QN Real Time PCR: NOT DETECTED IU/mL

## 2021-10-20 LAB — EBV DNA, QUANTITATIVE PCR
Epstein-Barr Virus DNA Quan PCR Log Copies/ML: 3.8 Log cps/mL — ABNORMAL HIGH
Epstein-Barr virus, DNA, QUAN PCR: 6333 copies/mL — ABNORMAL HIGH

## 2021-10-21 LAB — 17-HYDROXYPROGESTERONE: 17-Hydroxyprogesterone: 25 ng/dL

## 2021-10-29 ENCOUNTER — Other Ambulatory Visit (FREE_STANDING_LABORATORY_FACILITY): Payer: No Typology Code available for payment source

## 2021-10-29 DIAGNOSIS — Z944 Liver transplant status: Secondary | ICD-10-CM

## 2021-10-29 LAB — COMPREHENSIVE METABOLIC PANEL
ALT: 24 U/L (ref 0–55)
AST (SGOT): 27 U/L (ref 5–41)
Albumin/Globulin Ratio: 0.8 — ABNORMAL LOW (ref 0.9–2.2)
Albumin: 3.7 g/dL (ref 3.5–5.0)
Alkaline Phosphatase: 135 U/L — ABNORMAL HIGH (ref 37–117)
Anion Gap: 13 (ref 5.0–15.0)
BUN: 7 mg/dL (ref 7.0–21.0)
Bilirubin, Total: 0.4 mg/dL (ref 0.2–1.2)
CO2: 27 mEq/L (ref 17–29)
Calcium: 9.1 mg/dL (ref 8.5–10.5)
Chloride: 99 mEq/L (ref 99–111)
Creatinine: 0.9 mg/dL (ref 0.4–1.0)
Globulin: 4.5 g/dL — ABNORMAL HIGH (ref 2.0–3.6)
Glucose: 226 mg/dL — ABNORMAL HIGH (ref 70–100)
Potassium: 4 mEq/L (ref 3.5–5.3)
Protein, Total: 8.2 g/dL (ref 6.0–8.3)
Sodium: 139 mEq/L (ref 135–145)
eGFR: >60 mL/min/{1.73_m2} (ref >=60)

## 2021-10-29 LAB — CBC AND DIFFERENTIAL
Absolute NRBC: 0 10*3/uL (ref 0.00–0.00)
Basophils Absolute Automated: 0.01 10*3/uL (ref 0.00–0.08)
Basophils Automated: 0.3 %
Eosinophils Absolute Automated: 0.11 10*3/uL (ref 0.00–0.44)
Eosinophils Automated: 3.2 %
Hematocrit: 40.3 % (ref 34.7–43.7)
Hgb: 12.4 g/dL (ref 11.4–14.8)
Immature Granulocytes Absolute: 0.01 10*3/uL (ref 0.00–0.07)
Immature Granulocytes: 0.3 %
Instrument Absolute Neutrophil Count: 2.14 10*3/uL (ref 1.10–6.33)
Lymphocytes Absolute Automated: 0.94 10*3/uL (ref 0.42–3.22)
Lymphocytes Automated: 27.6 %
MCH: 20.7 pg — ABNORMAL LOW (ref 25.1–33.5)
MCHC: 30.8 g/dL — ABNORMAL LOW (ref 31.5–35.8)
MCV: 67.2 fL — ABNORMAL LOW (ref 78.0–96.0)
MPV: 9.6 fL (ref 8.9–12.5)
Monocytes Absolute Automated: 0.19 10*3/uL — ABNORMAL LOW (ref 0.21–0.85)
Monocytes: 5.6 %
Neutrophils Absolute: 2.14 10*3/uL (ref 1.10–6.33)
Neutrophils: 63 %
Nucleated RBC: 0 /100 WBC (ref 0.0–0.0)
Platelets: 141 10*3/uL — ABNORMAL LOW (ref 142–346)
RBC: 6 10*6/uL — ABNORMAL HIGH (ref 3.90–5.10)
RDW: 18 % — ABNORMAL HIGH (ref 11–15)
WBC: 3.4 10*3/uL (ref 3.10–9.50)

## 2021-10-29 LAB — SIROLIMUS LEVEL: Rapamycin: 9.8 ng/mL (ref 5.0–15.0)

## 2021-10-29 LAB — GGT: GGT: <4 U/L — ABNORMAL LOW (ref 10–70)

## 2021-10-29 LAB — BILIRUBIN, DIRECT: Bilirubin Direct: 0.2 mg/dL (ref 0.0–0.5)

## 2021-10-29 LAB — HEMOLYSIS INDEX: Hemolysis Index: 0 Index (ref 0–24)

## 2021-10-29 LAB — MAGNESIUM: Magnesium: 1.7 mg/dL (ref 1.6–2.6)

## 2021-10-29 LAB — PHOSPHORUS: Phosphorus: 3.4 mg/dL (ref 2.3–4.7)

## 2021-10-31 LAB — CYTOMEGALOVIRUS DNA, QUANTITATIVE REAL-TIME PCR
CMV DNA, QN PCR: NOT DETECTED log IU/mL
CMV DNA,QN Real Time PCR: NOT DETECTED IU/mL

## 2021-10-31 LAB — EBV DNA, QUANTITATIVE PCR
Epstein-Barr Virus DNA Quan PCR Log Copies/ML: 4.07 Log cps/mL — ABNORMAL HIGH
Epstein-Barr virus, DNA, QUAN PCR: 11798 copies/mL — ABNORMAL HIGH

## 2022-01-11 ENCOUNTER — Other Ambulatory Visit (FREE_STANDING_LABORATORY_FACILITY): Payer: No Typology Code available for payment source

## 2022-01-11 DIAGNOSIS — Z944 Liver transplant status: Secondary | ICD-10-CM

## 2022-01-11 LAB — COMPREHENSIVE METABOLIC PANEL
ALT: 20 U/L (ref 0–55)
AST (SGOT): 21 U/L (ref 5–41)
Albumin/Globulin Ratio: 0.6 — ABNORMAL LOW (ref 0.9–2.2)
Albumin: 3.1 g/dL — ABNORMAL LOW (ref 3.5–5.0)
Alkaline Phosphatase: 143 U/L — ABNORMAL HIGH (ref 37–117)
Anion Gap: 10 (ref 5.0–15.0)
BUN: 7 mg/dL (ref 7.0–21.0)
Bilirubin, Total: 0.3 mg/dL (ref 0.2–1.2)
CO2: 25 mEq/L (ref 17–29)
Calcium: 8.9 mg/dL (ref 8.5–10.5)
Chloride: 103 mEq/L (ref 99–111)
Creatinine: 0.8 mg/dL (ref 0.4–1.0)
Globulin: 5 g/dL — ABNORMAL HIGH (ref 2.0–3.6)
Glucose: 260 mg/dL — ABNORMAL HIGH (ref 70–100)
Potassium: 4.6 mEq/L (ref 3.5–5.3)
Protein, Total: 8.1 g/dL (ref 6.0–8.3)
Sodium: 138 mEq/L (ref 135–145)
eGFR: >60 mL/min/{1.73_m2} (ref >=60)

## 2022-01-11 LAB — PHOSPHORUS: Phosphorus: 3.6 mg/dL (ref 2.3–4.7)

## 2022-01-11 LAB — CBC AND DIFFERENTIAL
Absolute NRBC: 0 10*3/uL (ref 0.00–0.00)
Basophils Absolute Automated: 0.02 10*3/uL (ref 0.00–0.08)
Basophils Automated: 0.5 %
Eosinophils Absolute Automated: 0.08 10*3/uL (ref 0.00–0.44)
Eosinophils Automated: 2.2 %
Hematocrit: 41.6 % (ref 34.7–43.7)
Hgb: 12 g/dL (ref 11.4–14.8)
Immature Granulocytes Absolute: 0.01 10*3/uL (ref 0.00–0.07)
Immature Granulocytes: 0.3 %
Instrument Absolute Neutrophil Count: 2.48 10*3/uL (ref 1.10–6.33)
Lymphocytes Absolute Automated: 0.91 10*3/uL (ref 0.42–3.22)
Lymphocytes Automated: 24.7 %
MCH: 18.8 pg — ABNORMAL LOW (ref 25.1–33.5)
MCHC: 28.8 g/dL — ABNORMAL LOW (ref 31.5–35.8)
MCV: 65.1 fL — ABNORMAL LOW (ref 78.0–96.0)
MPV: 9.9 fL (ref 8.9–12.5)
Monocytes Absolute Automated: 0.19 10*3/uL — ABNORMAL LOW (ref 0.21–0.85)
Monocytes: 5.1 %
Neutrophils Absolute: 2.48 10*3/uL (ref 1.10–6.33)
Neutrophils: 67.2 %
Nucleated RBC: 0 /100 WBC (ref 0.0–0.0)
Platelets: 152 10*3/uL (ref 142–346)
RBC: 6.39 10*6/uL — ABNORMAL HIGH (ref 3.90–5.10)
RDW: 21 % — ABNORMAL HIGH (ref 11–15)
WBC: 3.69 10*3/uL (ref 3.10–9.50)

## 2022-01-11 LAB — CELL MORPHOLOGY
Cell Morphology: ABNORMAL — AB
Platelet Estimate: NORMAL

## 2022-01-11 LAB — MAGNESIUM: Magnesium: 1.7 mg/dL (ref 1.6–2.6)

## 2022-01-11 LAB — GGT: GGT: 11 U/L (ref 10–70)

## 2022-01-11 LAB — HEMOLYSIS INDEX: Hemolysis Index: 9 Index (ref 0–24)

## 2022-01-11 LAB — SIROLIMUS LEVEL: Rapamycin: 7.8 ng/mL (ref 5.0–15.0)

## 2022-01-11 LAB — BILIRUBIN, DIRECT: Bilirubin Direct: 0.1 mg/dL (ref 0.0–0.5)

## 2022-01-12 LAB — CYTOMEGALOVIRUS DNA, QUANTITATIVE REAL-TIME PCR
CMV DNA, QN PCR: NOT DETECTED log IU/mL
CMV DNA,QN Real Time PCR: NOT DETECTED IU/mL

## 2022-01-13 LAB — EBV DNA, QUANTITATIVE PCR
Epstein-Barr Virus DNA Quan PCR Log Copies/ML: 3.56 Log cps/mL — ABNORMAL HIGH
Epstein-Barr virus, DNA, QUAN PCR: 3643 copies/mL — ABNORMAL HIGH

## 2022-03-11 ENCOUNTER — Other Ambulatory Visit (INDEPENDENT_AMBULATORY_CARE_PROVIDER_SITE_OTHER): Payer: Self-pay | Admitting: Pediatric Pulmonology

## 2022-03-18 ENCOUNTER — Other Ambulatory Visit (FREE_STANDING_LABORATORY_FACILITY): Payer: No Typology Code available for payment source

## 2022-03-18 DIAGNOSIS — Z944 Liver transplant status: Secondary | ICD-10-CM

## 2022-03-18 DIAGNOSIS — R7989 Other specified abnormal findings of blood chemistry: Secondary | ICD-10-CM

## 2022-03-18 LAB — CBC AND DIFFERENTIAL
Absolute NRBC: 0 10*3/uL (ref 0.00–0.00)
Basophils Absolute Automated: 0.01 10*3/uL (ref 0.00–0.08)
Basophils Automated: 0.2 %
Eosinophils Absolute Automated: 0.08 10*3/uL (ref 0.00–0.44)
Eosinophils Automated: 1.2 %
Hematocrit: 42.3 % (ref 34.7–43.7)
Hgb: 12.3 g/dL (ref 11.4–14.8)
Immature Granulocytes Absolute: 0.02 10*3/uL (ref 0.00–0.07)
Immature Granulocytes: 0.3 %
Instrument Absolute Neutrophil Count: 5.02 10*3/uL (ref 1.10–6.33)
Lymphocytes Absolute Automated: 1.08 10*3/uL (ref 0.42–3.22)
Lymphocytes Automated: 16.7 %
MCH: 18.9 pg — ABNORMAL LOW (ref 25.1–33.5)
MCHC: 29.1 g/dL — ABNORMAL LOW (ref 31.5–35.8)
MCV: 65.1 fL — ABNORMAL LOW (ref 78.0–96.0)
MPV: 9.5 fL (ref 8.9–12.5)
Monocytes Absolute Automated: 0.27 10*3/uL (ref 0.21–0.85)
Monocytes: 4.2 %
Neutrophils Absolute: 5.02 10*3/uL (ref 1.10–6.33)
Neutrophils: 77.4 %
Nucleated RBC: 0 /100 WBC (ref 0.0–0.0)
Platelets: 171 10*3/uL (ref 142–346)
RBC: 6.5 10*6/uL — ABNORMAL HIGH (ref 3.90–5.10)
RDW: 20 % — ABNORMAL HIGH (ref 11–15)
WBC: 6.48 10*3/uL (ref 3.10–9.50)

## 2022-03-18 LAB — COMPREHENSIVE METABOLIC PANEL
ALT: 20 U/L (ref 0–55)
AST (SGOT): 19 U/L (ref 5–41)
Albumin/Globulin Ratio: 0.6 — ABNORMAL LOW (ref 0.9–2.2)
Albumin: 3.2 g/dL — ABNORMAL LOW (ref 3.5–5.0)
Alkaline Phosphatase: 137 U/L — ABNORMAL HIGH (ref 37–117)
Anion Gap: 9 (ref 5.0–15.0)
BUN: 5 mg/dL — ABNORMAL LOW (ref 7.0–21.0)
Bilirubin, Total: 0.3 mg/dL (ref 0.2–1.2)
CO2: 26 mEq/L (ref 17–29)
Calcium: 8.8 mg/dL (ref 8.5–10.5)
Chloride: 100 mEq/L (ref 99–111)
Creatinine: 0.8 mg/dL (ref 0.4–1.0)
Globulin: 5 g/dL — ABNORMAL HIGH (ref 2.0–3.6)
Glucose: 213 mg/dL — ABNORMAL HIGH (ref 70–100)
Potassium: 4.4 mEq/L (ref 3.5–5.3)
Protein, Total: 8.2 g/dL (ref 6.0–8.3)
Sodium: 135 mEq/L (ref 135–145)
eGFR: >60 mL/min/{1.73_m2} (ref >=60)

## 2022-03-18 LAB — HEMOLYSIS INDEX(SOFT): Hemolysis Index: 3 Index (ref 0–24)

## 2022-03-18 LAB — PT/INR
PT INR: 1 (ref 0.9–1.1)
PT: 11.3 s (ref 10.1–12.9)

## 2022-03-18 LAB — SIROLIMUS LEVEL: Rapamycin: 6.4 ng/mL (ref 5.0–15.0)

## 2022-03-20 LAB — LAB USE ONLY - CYTOMEGALOVIRUS (CMV) DNA, QUANTITATIVE REAL-TIME PCR
CMV DNA, QN PCR: NOT DETECTED log IU/mL
CMV DNA,QN Real Time PCR: NOT DETECTED IU/mL

## 2022-03-21 LAB — LAB USE ONLY - EPSTEIN-BARR VIRUS (EBV) DNA, QUANTITATIVE REAL-TIME PCR
Epstein-Barr Virus DNA Quan PCR Log Copies/mL: 3.77 Log cps/mL — ABNORMAL HIGH
Epstein-Barr Virus, DNA, Quantitative PCR: 5910 copies/mL — ABNORMAL HIGH

## 2022-09-23 ENCOUNTER — Other Ambulatory Visit (FREE_STANDING_LABORATORY_FACILITY): Payer: No Typology Code available for payment source

## 2022-09-23 DIAGNOSIS — Z944 Liver transplant status: Secondary | ICD-10-CM

## 2022-09-23 LAB — LAB USE ONLY - CBC WITH DIFFERENTIAL
Absolute Basophils: 0.02 10*3/uL (ref 0.00–0.08)
Absolute Eosinophils: 0.13 10*3/uL (ref 0.00–0.44)
Absolute Immature Granulocytes: 0.01 10*3/uL (ref 0.00–0.07)
Absolute Lymphocytes: 1.2 10*3/uL (ref 0.42–3.22)
Absolute Monocytes: 0.24 10*3/uL (ref 0.21–0.85)
Absolute Neutrophils: 3 10*3/uL (ref 1.10–6.33)
Absolute nRBC: 0 10*3/uL (ref <=0.00)
Basophils %: 0.4 %
Eosinophils %: 2.8 %
Hematocrit: 40.8 % (ref 34.7–43.7)
Hemoglobin: 12.4 g/dL (ref 11.4–14.8)
Immature Granulocytes %: 0.2 %
Lymphocytes %: 26.1 %
MCH: 22.2 pg — ABNORMAL LOW (ref 25.1–33.5)
MCHC: 30.4 g/dL — ABNORMAL LOW (ref 31.5–35.8)
MCV: 73 fL — ABNORMAL LOW (ref 78.0–96.0)
MPV: 9.7 fL (ref 8.9–12.5)
Monocytes %: 5.2 %
Neutrophils %: 65.3 %
Platelet Count: 183 10*3/uL (ref 142–346)
Preliminary Absolute Neutrophil Count: 3 10*3/uL (ref 1.10–6.33)
RBC: 5.59 10*6/uL — ABNORMAL HIGH (ref 3.90–5.10)
RDW: 23 % — ABNORMAL HIGH (ref 11–15)
WBC: 4.6 10*3/uL (ref 3.10–9.50)
nRBC %: 0 /100 WBC (ref <=0.0)

## 2022-09-23 LAB — COMPREHENSIVE METABOLIC PANEL
ALT: 20 U/L (ref 0–55)
AST (SGOT): 22 U/L (ref 5–41)
Albumin/Globulin Ratio: 0.7 — ABNORMAL LOW (ref 0.9–2.2)
Albumin: 3.1 g/dL — ABNORMAL LOW (ref 3.5–5.0)
Alkaline Phosphatase: 119 U/L — ABNORMAL HIGH (ref 37–117)
Anion Gap: 9 (ref 5.0–15.0)
BUN: 7 mg/dL (ref 7–21)
Bilirubin, Total: 0.4 mg/dL (ref 0.2–1.2)
CO2: 24 mEq/L (ref 17–29)
Calcium: 8.7 mg/dL (ref 8.5–10.5)
Chloride: 105 mEq/L (ref 99–111)
Creatinine: 0.7 mg/dL (ref 0.4–1.0)
GFR: >60 mL/min/{1.73_m2} (ref >=60.0)
Globulin: 4.7 g/dL — ABNORMAL HIGH (ref 2.0–3.6)
Glucose: 90 mg/dL (ref 70–100)
Hemolysis Index: 8 Index
Potassium: 4.5 mEq/L (ref 3.5–5.3)
Protein, Total: 7.8 g/dL (ref 6.0–8.3)
Sodium: 138 mEq/L (ref 135–145)

## 2022-09-23 LAB — SIROLIMUS LEVEL: Rapamycin: 5.8 ng/mL (ref 5.0–15.0)

## 2022-09-23 LAB — LAB USE ONLY - MORPHOLOGY REVIEW
Platelet Estimate: NORMAL
RBC Morphology: ABNORMAL — AB

## 2022-09-23 LAB — PT/INR
INR: 0.9 (ref 0.9–1.1)
PT: 11 s (ref 10.1–12.9)

## 2022-09-25 LAB — CYTOMEGALOVIRUS (CMV) DNA, QUALITATIVE REAL-TIME PCR: Cytomegalovirus DNA, Qualitative PCR: NOT DETECTED

## 2022-09-26 LAB — PLASMA, EPSTEIN-BARR VIRUS (EBV) DNA, QUANTITATIVE RT PR
EBV DNA Quant PCR IU/mL: <35 IU/mL — ABNORMAL HIGH
EBV DNA Quant PCR Log IU/mL: <1.54 Log IU/mL — ABNORMAL HIGH

## 2022-10-10 ENCOUNTER — Other Ambulatory Visit (INDEPENDENT_AMBULATORY_CARE_PROVIDER_SITE_OTHER): Payer: Self-pay | Admitting: Pediatric Pulmonology

## 2023-08-31 NOTE — Progress Notes (Signed)
 Follow Up Consultation Note  [facility]yst[facility]rosis [facility]       Initial Visit  Kortlynn, Poust, [id number]    Primary Care Provider: Floretta Yvonna CROME, MD  Date of Visit: 08/31/2023     Patient Active Problem List   Diagnosis Code   . 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 Splenomegaly R16.1   . Thrombocytopenia D69.6   . Hyperbilirubinemia E80.6   . Acute liver failure K72.00   . 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 Pancreatic insufficiency due to cystic fibrosis E84.8, K86.89   . Pneumonia due to Serratia marcescens J15.69   . Pneumonia of both lower lobes due to methicillin susceptible Staphylococcus aureus (MSSA) J15.211   . Malnutrition E46   . Other ascites R18.8   . Constipation K59.00   . Hyponatremia E87.1   . Acute kidney injury N17.9   . Insulin pump in place Z96.41   . Liver transplant recipient Z51.4   . Weight loss R63.4   . Poor weight gain in adult R62.7   . Elevated liver enzymes R74.8   . Poor weight gain in adult R62.7   . Poor weight gain in adult R62.7   . Hyperkalemia E87.5   . 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 Liver transplant complication T86.40   . Fever in other diseases R50.81   . Immunosuppressive management encounter following liver transplant Z79.60, Z94.4   . Secondary diabetes mellitus E13.9   . 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 Osteopenia M85.80   . Liver dysfunction K76.89   . Immunocompromised state D84.9   . 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 Bronchopneumonia due to methicillin resistant Staphylococcus aureus (MRSA) J15.212   . 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    History of the Present Illness:     Keerthana is a 23 y.o. female with a history of cystic fibrosis who presents for evaluation of her CF.  She has a history fo CFRD and liver failure and is status post liver transplant on 04/19/2019. Jaquesha has a baseline FEV1 of around 60 % predicted.  This has been stable over the past year.  She has no cough.  Over the last year, She has had no pulmonary exacerbations requiring hospitalization or IV antibiotics.     From a GI standpoint, she  has a history of pancreatic insufficiency.  There is no history of recent DIOS.  Treatments in the past include pancreatic enzymes.     From an ENT standpoint, Amberlee reports that her sinuses are stable.    From an endocrine standpoint, Windsor has a history of cystic fibrosis related diabetes.  She is checking blood sugars.  She uses both short acting and long acting insulin.   Her social support includes her family.    Of note, there is no history of unintentional weight loss, decreased  appetite, and no history of cancer.  There is also no history of cigarette, illicit drug, or alcohol abuse.    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  There is no history of ABPA, embolization for hemoptysis, and pneumothorax.    08/31/2023:     Start Alyftrek- a medication approved by the FDA in December 2024 for individuals with cystic fibrosis with one F508del mutation or another responsive mutation, which she has. This is medically necessary given her baseline lung function of 60-65 - as she would benefit from the 10-13% improved lung function as well as the 63% reduction in pulmonary exacerbations requiring IV antibiotics and/or hospitalization. She has no contraindications for this medication, and I have counseled her extensively today on the correct use for taking it. She is aware  she will need liver enzyme tests done monthly for the first 6 months, then every 3 months for one year, then at minimum yearly thereafter. Baseline liver enzymes are not significantly elevated.  I have verified the following and made necessary changes:  She is not on CYP3A inhibitors such as antifungals or certain macrolides.    If she is on a strong inhibitor (ketoconazole, itraconazole, posaconazole, voriconazole, clarithromycin), the dose of Alyftrek will be reduced to one tablet once weekly.    If she is on a moderate inhibitor (fluconazole, erythromycin), the dose of Alyftrek will be reduced to 1 tablet every other day   If she is on these, will need LFTs will be checked monthly  She is not on CYP3A inducers (rifampin, rifabutin, phenytoin) which is not recommended to be taken with Alyftrek.  She does not have liver dysfunction and therefore does not need dose adjustment or more frequent monitoring.  Patient was counseled to take 2 tablets by mouth once daily at approximately the same time each day with a fat containing food  Missed doses  If 6 hours or less have passed since the missed dose, take the missed dose as soon as possible and continue on the original schedule.    If more than 6 hours have passed since the missed dose, skip the missed dose, and continue on the original schedule the next day.  Side Effects/Monitoring:  Reviewed common side effects of cough, headache, sinus congestion, rash, nausea, and dizziness.  Reviewed need for liver labs MONTHLY for the first 6 months, then every 3 months for the next 12 months then at least annually thereafter     Past Medical History:     Past Medical History:   Diagnosis Date   . CF (cystic fibrosis)     F508del/359InsT diagnosed age 67 mos for FTT   . 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 DM (diabetes mellitus), secondary    . Liver transplant recipient        Past Surgical History:      Past Surgical History:   Procedure Laterality Date   . COLONOSCOPY W/  BIOPSIES N/A 09/01/2020    Procedure: Colonoscopy, With Biopsy;  Surgeon: Camie Lamarr Sharps, MD;  Location: [facility];  Service: Pediatric Gastrointestinal;  Laterality: N/A;   . EGD (ESOPHAGOGASTRODUODENOSCOPY) WITH BIOPSY N/A 09/01/2020    Procedure: EGD, With Biopsy;  Surgeon: Camie Lamarr Sharps, MD;  Location: [facility];  Service: Pediatric Gastrointestinal;  Laterality: N/A;   . FOREARM FRACTURE SURGERY     . LIVER TRANSPLANT RECIPIENT FROM CADAVERIC DONOR N/A 04/20/2019    Procedure: TRANSPLANT, LIVER, RECIPIENT FROM DECEASED DONOR  UNOS: [id number];  Surgeon: Prentice Sean Smalls, MD PhD;  Location: [facility] ZB[facility] 3;  Service: Elizabethann;  Laterality: N/A;   . LIVER TRANSPLANT RECIPIENT FROM CADAVERIC DONOR  04/19/2019   . TONSILLECTOMY Bilateral     age 89 y       Family History:     Family History   Problem Relation Age of Onset   . Asthma Mother    . Asthma Father    . No Known Problems Sister    . No Known Problems Brother    . No Known Problems Brother    . Cystic fibrosis Neg Hx        Social History:       Social History     Socioeconomic History   . Marital status: Single     Spouse name: Not on file   . Number of children: Not on file   . Years of education: Not on file   . Highest education level: Not on file   Occupational History   . Not on file   Tobacco Use   . Smoking status: Never   . Smokeless tobacco: Never   Vaping Use   . Vaping status: Never Used   Substance and Sexual Activity   . Alcohol use: Never     Alcohol/week: 0.0 standard drinks of alcohol   . Drug use: Never   . Sexual activity: Never   Other Topics Concern   . Not on file   Social History Narrative   . Not on file     Social Drivers of Health     Financial Resource Strain: Low Risk  (11/03/2022)    Overall Financial Resource Strain (CARDIA)    . Difficulty of Paying Living Expenses: Not hard at all   Food Insecurity: No Food Insecurity (11/03/2022)    Hunger Vital Sign    . Worried About Programme Researcher, Broadcasting/film/video in the Last Year: Never  true    . Ran Out of Food in the Last Year: Never true   Transportation Needs: No Transportation Needs (11/03/2022)    Transportation    . Lack of Transportation: No   Physical Activity: Sufficiently Active (11/03/2022)    Exercise Vital Sign    . Days of Exercise per Week: 5 days    . Minutes of Exercise per Session: 40 min   Stress: No Stress Concern Present (11/03/2022)    [employer] of Occupational Health - Occupational Stress Questionnaire    . Feeling of Stress : Not at all   Social Connections: Moderately Integrated (11/03/2022)    Social Connection and Isolation Panel [NHANES]    . Frequency of Communication with Friends and Family: More than three times a week    . Frequency of Social Gatherings with Friends and Family: More than three times a week    . Attends Religious Services: 1 to 4 times per year    . Active Member of Clubs or Organizations: Yes    . Attends Banker Meetings: 1 to 4 times per year    . Marital Status: Never married   Housing Stability: Unknown (11/03/2022)    Housing Stability Vital Sign    . Unable to Pay for Housing in the Last Year: No    . Number of Times Moved in the Last Year: Not on file    . Unstable Housing in the Last Year: Not on file       Allergies:  Allergies/Adverse Reactions/Intolerances   Allergen Reactions   . Nut Flavor      psistacchio only face swelling    . Tree Nuts Rash        Medications:     Current Outpatient Medications   Medication Sig Dispense Refill   . albuterol  (PROVENTIL ) 0.083 % nebulizer solution Take 1 vial (2.5 mg total) by nebulization every 6 (six) hours as needed. 1080 mL 3   . BD Veo Insulin Syringe UF 0.3 mL 31 gauge x 15/64 Syrg USE AS DIRECTED FOR INJECTING INSULIN FOR BACK UP IN CASE OF PUMP FAILURE     . blood sugar diagnostic (glucose blood) Strp Use as directed to test blood sugar up to 10 times daily. 100 strip 1   . blood-glucose meter Misc Use as directed to test blood sugar 1 each 1   . blood-glucose meter,continuous  (DEXCOM G7 RECEIVER MISC) by Miscellaneous route.     . cetirizine (ZYRTEC) 10 MG tablet Take 1 tablet (10 mg total) by mouth daily.  0   . dornase alfa (PULMOZYME) 1 mg/mL nebulizer solution Take 2.5 mLs (2.5 mg total) by nebulization once daily.     SABRA elexacaftor-tezacaftor-ivacaftor (TRIKAFTA) 100-50-75 mg /150 mg tablet Take 2 orange tablets by mouth in the morning and 1 blue tablet by mouth in the evening roughly 12 hours apart with a fat containing food 252 tablet 3   . ergocalciferol (Vitamin D2) 1,250 mcg (50,000 unit) capsule Take 1 capsule (50,000 Units total) by mouth once a week. 12 capsule 0   . famotidine (PEPCID) 20 MG tablet Take 1 tablet (20 mg total) by mouth daily as needed.  0   . ferrous SULFATE 325 mg, 65 mg elemental iron, 325 mg (65 mg iron) tablet TAKE 1 TABLET BY MOUTH EVERY DAY WITH BREAKFAST 90 tablet 1   . Glucagon, HCl, Emergency Kit 1 mg SolR      . HumaLOG U-100 Insulin 100 unit/mL injection SEE INSTRUCTIONS. USE AS DIRECTED UP TO 100 UNITS PER DAY IN INSULIN PUMP.     . multivitamin (DEKAS PLUS) 200 mcg-1,000 mcg-10 mg Cap capsule Take 1 capsule by mouth daily. 60 capsule 3   . OneTouch Delica Plus Lancet 33 gauge Misc CHECK BLOOD GLUCOSE UP TO 10 TIMES DAILY     . pancrelipase , Lip-Prot-Amyl, (Creon ) 12,000-38,000 -60,000 unit capsule Take 3 capsules by mouth 3 (three) times daily with meals.     . SIROlimus  (RAPAMUNE ) 1 MG tablet Take 3 tablets (3 mg total) by mouth once daily. 270 tablet 1   . sodium chloride  7 % nebulizer solution Take 4 mLs by nebulization 2 (two) times daily. 60 vial 3     No current facility-administered medications for this visit.       Review of Systems:   Pertinent items are noted in HPI.  All other systems reviewed and are negative.    Vital Signs:     BP 136/85   Pulse 113   Temp 36.9 C (98.4 F) (Temporal)   Resp 16   Ht 1.588 m (5' 2.5)   Wt 52.6 kg (116 lb)   LMP 06/27/2023   SpO2 97%   BMI 20.88 kg/m   Pain Score: 0-No pain   Wt Readings  from Last 3 Encounters:   08/31/23 52.6 kg (116 lb)   11/03/22 54.3 kg (119 lb 11.2 oz)   03/15/22 52.5 kg (115 lb 12.8 oz)       Physical Examination:  General:  Well-developed, well-nourished female in no apparent distress.  HEENT:  Normal cephalic, atraumatic, no facial asymmetry.  Pupils are equal, round, and reactive to light.  EOMI.  The nares are without lesions. The oropharynx is clear.  Neck:  Supple.  Trachea midline.  No lymphadenopathy.  Lungs:  Good breath sounds bilaterally.  Heart: Normal S1 and S2.  No murmurs, gallops, or rubs appreciated.  Abdomen: Soft, nondistended, nontender with active bowel sounds.  There is no hepatosplenomegaly and no masses.  Extremities:  Warm and well perfused.  No cyanosis or edema.  There is trace clubbing bilaterally throughout.  Neurologic: Within normal limits.  Skin: Without obvious lesions.    Pulmonary Function Testing:     Hospital Outpatient Visit on 08/31/2023   Component Date Value Ref Range Status   . FVC Pre (L) 08/31/2023 2.94  L Preliminary   . FVC Pre (% predicted) 08/31/2023 86  % Preliminary   . FEV1 Pre (L) 08/31/2023 1.94  L Preliminary   . FEV1 Pre (% predicted) 08/31/2023 64  % Preliminary   . FEV1/FVC Pre (%) 08/31/2023 66  % Preliminary   . FEF25-75 Pre (L) 08/31/2023 1.17  L/s Preliminary   . FEF25-75 (% predicted) 08/31/2023 31  % Preliminary     Last 5 FEV1:  FEV1 Pre (L)   Date Value Ref Range Status   08/31/2023 1.94 L Preliminary   11/03/2022 1.86 L Final     FEV1 Pre (% predicted)   Date Value Ref Range Status   08/31/2023 64 % Preliminary   11/03/2022 62 % Final       Last 5 FVC:  FVC Pre (L)   Date Value Ref Range Status   08/31/2023 2.94 L Preliminary   11/03/2022 2.63 L Final     FVC Pre (% predicted)   Date Value Ref Range Status   08/31/2023 86 % Preliminary   11/03/2022 77 % Final           Laboratory:     Review of laboratory data is notable for:    Review of recent labwork:    Lab Results   Component Value Date    WBC 4.0  05/05/2023    HGB 12.8 05/05/2023    HCT 43.2 05/05/2023    PLT 165 05/05/2023     Lab Results   Component Value Date    NA 137 05/05/2023    K 4.4 05/05/2023    CL 101 05/05/2023    BUN 8 05/05/2023    CREATININE 0.71 05/05/2023    GLU 224 (H) 05/05/2023    CALCIUM 9.0 05/05/2023    PROT 7.8 05/05/2023    ALBUMIN 3.8 (L) 05/05/2023    BILITOT 0.4 05/05/2023    ALKPHOS 133 (H) 05/05/2023    AST 20 05/05/2023    ALT 21 05/05/2023     Lab Results   Component Value Date    INR 1.0 05/05/2023    LABPT 10.9 05/05/2023    PTT 28.9 08/31/2020     Lab Results   Component Value Date    VITA 65 08/08/2019    VITE SEE BELOW 08/08/2019    VITD25OH 23 (L) 09/16/2021     Lab Results   Component Value Date    HGBA1C 8.0 (H) 09/16/2021    FERRITIN 55 04/16/2021    PREALBUMIN 25 05/04/2019       Recent cultures:  Lab Results   Component Value Date    [facility]  02/11/2019     Negative for acid-fast bacilli by Fluorochrome stain.    AFBCULT Negative for Acid Fast Bacillus at 6 weeks. 02/11/2019       Radiology:     In Epic    Problems/ Diagnoses:     Patient Active Problem List   Diagnosis Code   . 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 Splenomegaly R16.1   . Thrombocytopenia D69.6   . Hyperbilirubinemia E80.6   . Acute liver failure K72.00   . 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 Pancreatic insufficiency due to cystic fibrosis E84.8, K86.89   . Pneumonia due to Serratia marcescens J15.69   . Pneumonia of both lower lobes due to methicillin susceptible Staphylococcus aureus (MSSA) J15.211   . Malnutrition E46   . Other ascites R18.8   . Constipation K59.00   . Hyponatremia E87.1   . Acute kidney injury N17.9   . Insulin pump in place Z96.41   . Liver transplant recipient Z13.4   . Weight loss R63.4   . Poor weight gain in adult R62.7   . Elevated liver enzymes R74.8   . Poor weight gain in adult R62.7   . Poor weight gain in adult R62.7   . Hyperkalemia E87.5    . 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 Liver transplant complication T86.40   . Fever in other diseases R50.81   . Immunosuppressive management encounter following liver transplant Z79.60, Z94.4   . Secondary diabetes mellitus E13.9   . 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 Osteopenia M85.80   . Liver dysfunction K76.89   . Immunocompromised state D84.9   . 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 Bronchopneumonia due to methicillin resistant Staphylococcus aureus (MRSA) J15.212   . 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 female with a history of cystic fibrosis and moderate lung disease who is now post liver transplantation and clinically stable.    During the course of the visit we discussed the following issues:    Continue current CF medications  Insulin for CFRD - consider CGM and insulin pump  Defer immunosuppression to the liver transplant team  Disposition:  Ellyn was instructed on the importance of airways clearance, exercise, and adherence to prescribed medications.  She will return in 3 month(s) with pulmonary function tests prior to the next visit.  She was instructed to call with any questions or concerns which may arise in the interim.     I personally spent 80 minutes today, exclusive of procedures, providing care for this patient, including preparation, face to face time, EMR documentation and other services such as review of medical records, diagnostic results, patient education, counseling, coordination of care as specified in the encounter.       Electronic Signature: Sherlean Valery Pock, MD, MD MPH     Sherlean Pock, MD MPH  Associate Professor of Medicine and Epidemiology  [facility]  Division of Pulmonary and Critical Care Medicine  [phone] E.  [facility] [address]/ [address]  [place], [place] [address]  P [phone]  F [phone]  M [phone]  [email or web address] (  link: [email or web address])

## 2023-11-06 NOTE — Progress Notes (Signed)
 [facility]    Subjective:     Patient ID: Alison Boone is a 23 y.o. female who presents to [facility] to follow up. Referred by Floretta Yvonna CROME, MD.    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    She had an outpatient PET-CT in May 2022 which showed two short segments of mild colonic wall thickening in hepatic flexure and transverse colon.  She had a normal EGD and colonoscopy with biopsies.  Subsequent PET in September 2022 showed reassuring findings.     She was transitioned from Tacrolimus  to Sirolimus  in October 2022 due to persistent high titer EBV viremia.    She received Rituximab in October 2021 and again a 4-week course of Rituximab in February 2023    She was admitted in June 2023 with fever, particularly in the setting of an indwelling central line.    Cultures were negative.    She has been on Sirolimus  3 mg daily since August 2023.   She continues on Valcyte 900 mg BID. No prior positive CMV.  She has been getting monitoring labs closely including EBV.    -----------      Since I last saw her in December 2023,    Alk phos has been in 130s with occasional 150s, albeit with not many lab checks.    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  She is applying to med school now.    11/04/23 labs - EBV pending    11/2020 PET  IMPRESSION:  Outside Study -Presented For Second Opinion Interpretation:   1. Relatively stable bilateral lung changes of bronchiectasis, bronchial   wall thickening and groundglass opacities; sequela of cystic fibrosis.   Slightly decreasing FDG intensity of mediastinal and hilar lymphadenopathy   likely reactive,   inflammatory/infectious.   2.  Relatively stable splenomegaly with FDG intensity slightly less than   the liver, and without focal abnormal tracer activity.   Stable in size minimally FDG avid as enteric lymph nodes.   3. No new FDG avid lesion appearing since the prior 08/03/2020,   Multiple other findings as listed above.     CURRENT MEDICATIONS:   Current Outpatient Medications   Medication Sig Dispense Refill   . albuterol  (PROVENTIL ) 0.083 % nebulizer solution Take 1 vial (2.5 mg total) by nebulization every 6 (six) hours as needed. 1080 mL 3   . BD Veo Insulin Syringe UF 0.3 mL 31 gauge x 15/64 Syrg USE AS DIRECTED FOR INJECTING INSULIN FOR BACK UP IN CASE OF PUMP FAILURE     . blood sugar diagnostic (glucose blood) Strp Use as directed to test blood sugar up to 10 times daily. 100 strip 1   . blood-glucose meter Misc Use as directed to test blood sugar 1 each 1   . blood-glucose meter,continuous (DEXCOM G7 RECEIVER MISC) by Miscellaneous route.     . cetirizine (ZYRTEC) 10 MG tablet Take 1 tablet (10 mg total) by mouth daily.  0   . dornase alfa (PULMOZYME) 1 mg/mL nebulizer solution Take 2.5 mLs (2.5 mg total) by nebulization once daily.     SABRA elexacaftor-tezacaftor-ivacaftor (TRIKAFTA) 100-50-75 mg /150 mg tablet Take  2 orange tablets by mouth in the morning and 1 blue tablet by mouth in the evening roughly 12 hours apart with a fat containing food 252 tablet 3   . ergocalciferol (Vitamin D2) 1,250 mcg (50,000 unit) capsule Take 1 capsule (50,000 Units total) by mouth once a week. 12 capsule 0   . famotidine (PEPCID) 20 MG tablet Take 1 tablet (20 mg total) by mouth daily as needed.  0   . ferrous SULFATE 325 mg, 65 mg elemental iron, 325 mg (65 mg iron) tablet TAKE 1 TABLET BY MOUTH EVERY DAY WITH BREAKFAST 90 tablet 1   . Glucagon, HCl, Emergency Kit 1 mg SolR      . HumaLOG U-100 Insulin 100 unit/mL injection SEE INSTRUCTIONS. USE AS DIRECTED UP TO 100 UNITS PER DAY IN INSULIN PUMP.     . multivitamin (DEKAS PLUS) 200 mcg-1,000 mcg-10  mg Cap capsule Take 1 capsule by mouth daily. 60 capsule 3   . ondansetron  (ZOFRAN  ODT) 4 MG disintegrating tablet Take 1 tablet (4 mg total) by mouth every 8 (eight) hours as needed for Nausea. 42 tablet 1   . OneTouch Delica Plus Lancet 33 gauge Misc CHECK BLOOD GLUCOSE UP TO 10 TIMES DAILY     . pancrelipase , Lip-Prot-Amyl, (Creon ) 12,000-38,000 -60,000 unit capsule Take 3 capsules by mouth 3 (three) times daily with meals.     . SIROlimus  (RAPAMUNE ) 1 MG tablet TAKE 3 TABLETS ( 3 MG TOTAL) BY MOUTH DAILY 270 tablet 1   . sodium chloride  7 % nebulizer solution Take 4 mLs by nebulization 2 (two) times daily. 60 vial 3     No current facility-administered medications for this visit.       Objective:   Vital signs and nursing notes reviewed.    There were no vitals taken for this visit.    General: Alert, in NAD  Eyes: No icterus  Resp: Breathing comfortably on room air  Neuro: AAOx3, MAE, no asterixis  Skin: No jaundice    DATA:  I have independently reviewed available notes, labs/imaging studies, and pathology reports including from outside sources.       Assessment and Plan:     In summary, Alison Boone is a 23 y.o. years old female here for follow up.    She has CF related liver disease with cirrhosis and underwent liver transplant in January 2021.  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  She did get Rituximab in October 2021 for this.  She had PET-CT in May 2022 which showed two short segments of mild colonic wall thickening in hepatic flexure and transverse colon but normal colonoscopy with biopsies, and subsequent repeat PET-CT did not show any FDG avid concerning areas.  As a result, Tacrolimus  was switched to Sirolimus  in October 2022.   Due to persistent fluctuating EBV viremia, she completed a second course of Rituximab in February 2023.  She did require hospitalization in June 2023 for fever with no source found.    Since then, she has done very  well with good graft function, albeit with intermittent fluctuations in alkaline phosphatase up to 150s.  We will continue Sirolimus  3mg  daily (stable dose since August 2023) and target trough around 4-6. Will check for proteinuria and lipid panel.  Will continue monthly labs for now.  I did order an MRI/MRCP to evaluate for a possible biliary anastomotic stricture.    We stopped Valcyte and are monitoring CMV levels.  Regarding  her EBV, we can continue to watch this. Discussed with patient and mom that risk of PTLD does drop after the first year post transplant, and indeed the further out from transplant, PTLD tends to be EBV negative. If EBV significantly rises, we can consider another PET.    I have reminded her to schedule her DEXA scan.    She has established with Dr. Natalie West for management of her CF.    I will see her back in 6 months      Orders Placed This Encounter   Procedures   . EBV Quant NAT, Plasma     Standing Status:   Standing     Number of Occurrences:   12     Next Expected Occurrence:   11/07/2023     Expiration Date:   02/06/2025     Release result to MyChart (visible to patient and proxy)::   Immediate   . CMV Quant NAT     Standing Status:   Standing     Number of Occurrences:   12     Next Expected Occurrence:   11/07/2023     Expiration Date:   02/06/2025     Release result to MyChart (visible to patient and proxy)::   Immediate   . MRI MRCP W/WO Contrast     Standing Status:   Future     Expected Date:   11/07/2023     Expiration Date:   05/09/2025     Reason for exam::   Elevated alkaline phosphatase, post liver transplant     Is the patient pregnant?:   No     Does the patient have an ICD or pacemaker?:   No     Does the patient have any implantable metal or devices:   No     What is the patient's sedation requirement?:   No Sedation     This order may be modified according to Imaging department written protocol including the administration of contrast.:   Yes     Release result to  MyChart (visible to patient and proxy)::   Immediate   . Complete Blood Count (CBC) + Auto Diff     Standing Status:   Standing     Number of Occurrences:   12     Next Expected Occurrence:   11/07/2023     Expiration Date:   02/06/2025     Release result to MyChart (visible to patient and proxy)::   Immediate   . Comprehensive Metabolic Panel     Standing Status:   Standing     Number of Occurrences:   12     Next Expected Occurrence:   11/07/2023     Expiration Date:   02/06/2025     Release result to MyChart (visible to patient and proxy)::   Immediate   . Prothrombin Time + INR     Standing Status:   Standing     Number of Occurrences:   12     Next Expected Occurrence:   11/07/2023     Expiration Date:   02/06/2025     Is this to be a Timed Draw?:   No     Release result to MyChart (visible to patient and proxy)::   Immediate   . Sirolimus  level     Standing Status:   Standing     Number of Occurrences:   12     Next Expected Occurrence:   11/07/2023  Expiration Date:   02/06/2025     Release result to MyChart (visible to patient and proxy)::   Immediate   . Urinalysis     Standing Status:   Future     Number of Occurrences:   1     Expiration Date:   11/06/2024     Release result to MyChart (visible to patient and proxy)::   Immediate   . Lipid panel     Standing Status:   Future     Number of Occurrences:   1     Expiration Date:   02/06/2025     Release result to MyChart (visible to patient and proxy)::   Immediate   . Protein/Creatinine Ratio, Urine, Random     Standing Status:   Future     Number of Occurrences:   1     Expiration Date:   11/06/2024     Release result to MyChart (visible to patient and proxy)::   Immediate     A copy of this report will be sent to the referring provider(s).       A majority of the work and / or time of this visit was conducted by means of a two-way synchronous audio-video connection. Technical limitations of providing care through this modality were explained to the  patient/parent/guardian. Patient/Parent/Guardian has consented to this telehealth visit. I spent a total of 25 minutes in direct face-to-face time with the patient/parent/guardian during this visit. In addition, I spent 10 minutes in indirect care on the day of the encounter.    I attest that neither the patient nor I were physically located in hospital space for this telehealth visit.    Rosaline Killian, M.D.  Assistant Professor of Medicine  Transplant Hepatology  [facility] [employer]

## 2023-12-07 NOTE — Progress Notes (Signed)
 [facility] Visit Note      Alison Boone is a 23 y.o. female who presents with Sore Throat already on Amoxicillin from [facility].     History obtained from patient.     Assessment & Plan:      Assessment & Plan  Acute pharyngitis, unspecified etiology    Orders:  .  acetaminophen (TYLENOL) 325 mg cap; Take 1-2 capsules by mouth 4 (four) times a day for 7 days Max 12 capsules in 24 hours.  SABRA  POCT Strep Molecular       No follow-ups on file.    Subjective       Mouth or throat complaint       Allergies[1]    Review of Systems     Objective       Vital Signs:  BP 106/62   Pulse 79   Temp 97.8 F (36.6 C)   Resp 15   SpO2 97%   Breastfeeding No      Physical Exam  Vitals reviewed.   Constitutional:       General: She is not in acute distress.     Appearance: Normal appearance. She is well-developed. She is not ill-appearing, toxic-appearing or diaphoretic.   HENT:      Right Ear: Tympanic membrane, ear canal and external ear normal.      Left Ear: Tympanic membrane, ear canal and external ear normal.      Nose: Nose normal.      Mouth/Throat:      Lips: Pink. No lesions.      Mouth: Mucous membranes are moist. No injury, lacerations, oral lesions or angioedema.      Dentition: Normal dentition. Does not have dentures. No dental tenderness, gingival swelling, dental caries, dental abscesses or gum lesions.      Tongue: No lesions. Tongue does not deviate from midline.      Palate: No mass and lesions.      Pharynx: Uvula midline. Posterior oropharyngeal erythema present. No pharyngeal swelling, oropharyngeal exudate, uvula swelling or postnasal drip.      Tonsils: No tonsillar exudate or tonsillar abscesses.      Comments: Oropharynx without white exudate.  Some minor palatal petechiae, no hot potato voice or s/o PTA as uvula midline, non-edematous.  No trismus, oral ulcerations, necrotic lesions or bleeding   Cardiovascular:      Rate and Rhythm: Normal rate and regular rhythm.      Heart sounds:  Normal heart sounds, S1 normal and S2 normal. Heart sounds not distant. No murmur heard.     No friction rub. No gallop. No S3 or S4 sounds.   Abdominal:      Palpations: Abdomen is not rigid.   Lymphadenopathy:      Head:      Right side of head: Tonsillar adenopathy present. No submental, submandibular, preauricular, posterior auricular or occipital adenopathy.      Left side of head: Tonsillar adenopathy present. No submental, submandibular, preauricular, posterior auricular or occipital adenopathy.      Cervical: No cervical adenopathy.      Right cervical: No deep or posterior cervical adenopathy.     Left cervical: No deep or posterior cervical adenopathy.   Neurological:      Mental Status: She is alert.                                             [  1]  Allergies  Allergen Reactions   . Pistachio Nut Swelling     Other reaction(s): Facial Swelling  Other reaction(s): Facial Swelling
